# Patient Record
Sex: Female | Born: 1945
Health system: Southern US, Community
[De-identification: ages and names within clinical notes are randomized; demographics above are authoritative.]

## PROBLEM LIST (undated history)

## (undated) DIAGNOSIS — D509 Iron deficiency anemia, unspecified: Secondary | ICD-10-CM

## (undated) DIAGNOSIS — R0902 Hypoxemia: Secondary | ICD-10-CM

## (undated) DIAGNOSIS — K909 Intestinal malabsorption, unspecified: Secondary | ICD-10-CM

## (undated) HISTORY — DX: Iron deficiency anemia, unspecified: D50.9

## (undated) HISTORY — PX: PARTIAL HYSTERECTOMY: SHX80

## (undated) HISTORY — PX: HIATAL HERNIA REPAIR: SHX195

## (undated) HISTORY — DX: Intestinal malabsorption, unspecified: K90.9

## (undated) HISTORY — DX: Hypoxemia: R09.02

---

## 2012-05-04 DIAGNOSIS — K449 Diaphragmatic hernia without obstruction or gangrene: Secondary | ICD-10-CM

## 2012-05-04 HISTORY — DX: Diaphragmatic hernia without obstruction or gangrene: K44.9

## 2013-07-30 DIAGNOSIS — R109 Unspecified abdominal pain: Secondary | ICD-10-CM

## 2013-07-30 DIAGNOSIS — R519 Headache, unspecified: Secondary | ICD-10-CM | POA: Insufficient documentation

## 2013-07-30 HISTORY — DX: Unspecified abdominal pain: R10.9

## 2013-09-03 DIAGNOSIS — M545 Low back pain, unspecified: Secondary | ICD-10-CM | POA: Insufficient documentation

## 2013-09-03 DIAGNOSIS — K219 Gastro-esophageal reflux disease without esophagitis: Secondary | ICD-10-CM | POA: Insufficient documentation

## 2013-09-03 DIAGNOSIS — M940 Chondrocostal junction syndrome [Tietze]: Secondary | ICD-10-CM

## 2013-09-03 HISTORY — DX: Chondrocostal junction syndrome (tietze): M94.0

## 2013-09-03 HISTORY — DX: Low back pain, unspecified: M54.50

## 2013-09-03 HISTORY — DX: Gastro-esophageal reflux disease without esophagitis: K21.9

## 2013-12-12 DIAGNOSIS — F319 Bipolar disorder, unspecified: Secondary | ICD-10-CM

## 2013-12-12 DIAGNOSIS — M810 Age-related osteoporosis without current pathological fracture: Secondary | ICD-10-CM | POA: Insufficient documentation

## 2013-12-12 DIAGNOSIS — H269 Unspecified cataract: Secondary | ICD-10-CM | POA: Insufficient documentation

## 2013-12-12 DIAGNOSIS — K5732 Diverticulitis of large intestine without perforation or abscess without bleeding: Secondary | ICD-10-CM

## 2013-12-12 DIAGNOSIS — F419 Anxiety disorder, unspecified: Secondary | ICD-10-CM | POA: Insufficient documentation

## 2013-12-12 DIAGNOSIS — E785 Hyperlipidemia, unspecified: Secondary | ICD-10-CM

## 2013-12-12 HISTORY — DX: Bipolar disorder, unspecified: F31.9

## 2013-12-12 HISTORY — DX: Hyperlipidemia, unspecified: E78.5

## 2013-12-12 HISTORY — DX: Diverticulitis of large intestine without perforation or abscess without bleeding: K57.32

## 2013-12-12 HISTORY — DX: Age-related osteoporosis without current pathological fracture: M81.0

## 2013-12-12 HISTORY — DX: Anxiety disorder, unspecified: F41.9

## 2013-12-31 DIAGNOSIS — W108XXA Fall (on) (from) other stairs and steps, initial encounter: Secondary | ICD-10-CM

## 2013-12-31 HISTORY — DX: Fall (on) (from) other stairs and steps, initial encounter: W10.8XXA

## 2014-11-19 DIAGNOSIS — R748 Abnormal levels of other serum enzymes: Secondary | ICD-10-CM

## 2014-11-19 HISTORY — DX: Abnormal levels of other serum enzymes: R74.8

## 2014-11-22 DIAGNOSIS — N289 Disorder of kidney and ureter, unspecified: Secondary | ICD-10-CM

## 2014-11-22 DIAGNOSIS — J029 Acute pharyngitis, unspecified: Secondary | ICD-10-CM

## 2014-11-22 HISTORY — DX: Disorder of kidney and ureter, unspecified: N28.9

## 2014-11-22 HISTORY — DX: Acute pharyngitis, unspecified: J02.9

## 2015-01-24 DIAGNOSIS — Z1239 Encounter for other screening for malignant neoplasm of breast: Secondary | ICD-10-CM | POA: Diagnosis not present

## 2015-01-24 DIAGNOSIS — Z1231 Encounter for screening mammogram for malignant neoplasm of breast: Secondary | ICD-10-CM | POA: Diagnosis not present

## 2015-02-11 DIAGNOSIS — N289 Disorder of kidney and ureter, unspecified: Secondary | ICD-10-CM | POA: Diagnosis not present

## 2015-02-21 DIAGNOSIS — R7989 Other specified abnormal findings of blood chemistry: Secondary | ICD-10-CM | POA: Diagnosis not present

## 2015-02-21 DIAGNOSIS — R748 Abnormal levels of other serum enzymes: Secondary | ICD-10-CM | POA: Diagnosis not present

## 2015-06-16 DIAGNOSIS — M62838 Other muscle spasm: Secondary | ICD-10-CM | POA: Insufficient documentation

## 2015-06-16 HISTORY — DX: Other muscle spasm: M62.838

## 2015-09-19 DIAGNOSIS — H9203 Otalgia, bilateral: Secondary | ICD-10-CM | POA: Diagnosis not present

## 2015-09-19 DIAGNOSIS — R42 Dizziness and giddiness: Secondary | ICD-10-CM | POA: Diagnosis not present

## 2015-09-19 DIAGNOSIS — Z90710 Acquired absence of both cervix and uterus: Secondary | ICD-10-CM | POA: Diagnosis not present

## 2015-09-19 DIAGNOSIS — M810 Age-related osteoporosis without current pathological fracture: Secondary | ICD-10-CM | POA: Diagnosis not present

## 2015-09-19 DIAGNOSIS — R899 Unspecified abnormal finding in specimens from other organs, systems and tissues: Secondary | ICD-10-CM | POA: Diagnosis not present

## 2015-09-19 DIAGNOSIS — M542 Cervicalgia: Secondary | ICD-10-CM | POA: Diagnosis not present

## 2015-09-19 DIAGNOSIS — E785 Hyperlipidemia, unspecified: Secondary | ICD-10-CM | POA: Diagnosis not present

## 2015-09-19 DIAGNOSIS — R69 Illness, unspecified: Secondary | ICD-10-CM | POA: Diagnosis not present

## 2015-09-19 DIAGNOSIS — Z9851 Tubal ligation status: Secondary | ICD-10-CM | POA: Diagnosis not present

## 2015-09-19 DIAGNOSIS — R930 Abnormal findings on diagnostic imaging of skull and head, not elsewhere classified: Secondary | ICD-10-CM | POA: Diagnosis not present

## 2015-09-19 DIAGNOSIS — R011 Cardiac murmur, unspecified: Secondary | ICD-10-CM | POA: Diagnosis not present

## 2015-09-19 DIAGNOSIS — I6349 Cerebral infarction due to embolism of other cerebral artery: Secondary | ICD-10-CM | POA: Diagnosis not present

## 2015-09-19 DIAGNOSIS — Q670 Congenital facial asymmetry: Secondary | ICD-10-CM | POA: Diagnosis not present

## 2015-09-19 DIAGNOSIS — I6930 Unspecified sequelae of cerebral infarction: Secondary | ICD-10-CM | POA: Diagnosis not present

## 2015-09-19 DIAGNOSIS — K219 Gastro-esophageal reflux disease without esophagitis: Secondary | ICD-10-CM | POA: Diagnosis not present

## 2015-09-23 DIAGNOSIS — M542 Cervicalgia: Secondary | ICD-10-CM | POA: Diagnosis not present

## 2015-09-23 DIAGNOSIS — I679 Cerebrovascular disease, unspecified: Secondary | ICD-10-CM

## 2015-09-23 DIAGNOSIS — R42 Dizziness and giddiness: Secondary | ICD-10-CM | POA: Diagnosis not present

## 2015-09-23 HISTORY — DX: Cerebrovascular disease, unspecified: I67.9

## 2015-09-29 DIAGNOSIS — M6281 Muscle weakness (generalized): Secondary | ICD-10-CM | POA: Diagnosis not present

## 2015-09-29 DIAGNOSIS — R293 Abnormal posture: Secondary | ICD-10-CM | POA: Diagnosis not present

## 2015-09-29 DIAGNOSIS — M2560 Stiffness of unspecified joint, not elsewhere classified: Secondary | ICD-10-CM | POA: Diagnosis not present

## 2015-09-29 DIAGNOSIS — M542 Cervicalgia: Secondary | ICD-10-CM | POA: Diagnosis not present

## 2015-10-14 DIAGNOSIS — L821 Other seborrheic keratosis: Secondary | ICD-10-CM | POA: Diagnosis not present

## 2015-10-14 DIAGNOSIS — L720 Epidermal cyst: Secondary | ICD-10-CM | POA: Diagnosis not present

## 2015-10-14 DIAGNOSIS — D225 Melanocytic nevi of trunk: Secondary | ICD-10-CM | POA: Diagnosis not present

## 2015-10-15 DIAGNOSIS — R42 Dizziness and giddiness: Secondary | ICD-10-CM | POA: Diagnosis not present

## 2015-10-28 DIAGNOSIS — M62838 Other muscle spasm: Secondary | ICD-10-CM | POA: Diagnosis not present

## 2015-10-29 DIAGNOSIS — M6281 Muscle weakness (generalized): Secondary | ICD-10-CM | POA: Diagnosis not present

## 2015-10-29 DIAGNOSIS — M2560 Stiffness of unspecified joint, not elsewhere classified: Secondary | ICD-10-CM | POA: Diagnosis not present

## 2015-10-29 DIAGNOSIS — M542 Cervicalgia: Secondary | ICD-10-CM | POA: Diagnosis not present

## 2015-10-29 DIAGNOSIS — R293 Abnormal posture: Secondary | ICD-10-CM | POA: Diagnosis not present

## 2015-11-05 DIAGNOSIS — Z961 Presence of intraocular lens: Secondary | ICD-10-CM | POA: Diagnosis not present

## 2015-11-05 DIAGNOSIS — H52203 Unspecified astigmatism, bilateral: Secondary | ICD-10-CM | POA: Diagnosis not present

## 2015-11-05 DIAGNOSIS — H524 Presbyopia: Secondary | ICD-10-CM | POA: Diagnosis not present

## 2015-12-22 DIAGNOSIS — M159 Polyosteoarthritis, unspecified: Secondary | ICD-10-CM

## 2015-12-22 DIAGNOSIS — R7989 Other specified abnormal findings of blood chemistry: Secondary | ICD-10-CM | POA: Diagnosis not present

## 2015-12-22 DIAGNOSIS — Z0000 Encounter for general adult medical examination without abnormal findings: Secondary | ICD-10-CM | POA: Diagnosis not present

## 2015-12-22 DIAGNOSIS — K760 Fatty (change of) liver, not elsewhere classified: Secondary | ICD-10-CM | POA: Insufficient documentation

## 2015-12-22 DIAGNOSIS — E611 Iron deficiency: Secondary | ICD-10-CM | POA: Diagnosis not present

## 2015-12-22 DIAGNOSIS — M8949 Other hypertrophic osteoarthropathy, multiple sites: Secondary | ICD-10-CM

## 2015-12-22 DIAGNOSIS — Z1159 Encounter for screening for other viral diseases: Secondary | ICD-10-CM | POA: Diagnosis not present

## 2015-12-22 DIAGNOSIS — M150 Primary generalized (osteo)arthritis: Secondary | ICD-10-CM

## 2015-12-22 HISTORY — DX: Primary generalized (osteo)arthritis: M15.0

## 2015-12-22 HISTORY — DX: Fatty (change of) liver, not elsewhere classified: K76.0

## 2015-12-22 HISTORY — DX: Polyosteoarthritis, unspecified: M15.9

## 2015-12-22 HISTORY — DX: Other hypertrophic osteoarthropathy, multiple sites: M89.49

## 2015-12-26 DIAGNOSIS — E611 Iron deficiency: Secondary | ICD-10-CM | POA: Diagnosis not present

## 2015-12-29 DIAGNOSIS — R7989 Other specified abnormal findings of blood chemistry: Secondary | ICD-10-CM | POA: Diagnosis not present

## 2015-12-30 DIAGNOSIS — D509 Iron deficiency anemia, unspecified: Secondary | ICD-10-CM | POA: Diagnosis not present

## 2015-12-30 DIAGNOSIS — R5383 Other fatigue: Secondary | ICD-10-CM | POA: Diagnosis not present

## 2016-01-13 DIAGNOSIS — D500 Iron deficiency anemia secondary to blood loss (chronic): Secondary | ICD-10-CM | POA: Diagnosis not present

## 2016-01-13 DIAGNOSIS — D508 Other iron deficiency anemias: Secondary | ICD-10-CM | POA: Insufficient documentation

## 2016-01-13 HISTORY — DX: Other iron deficiency anemias: D50.8

## 2016-01-22 DIAGNOSIS — K640 First degree hemorrhoids: Secondary | ICD-10-CM | POA: Diagnosis not present

## 2016-01-22 DIAGNOSIS — K649 Unspecified hemorrhoids: Secondary | ICD-10-CM | POA: Diagnosis not present

## 2016-01-22 DIAGNOSIS — K449 Diaphragmatic hernia without obstruction or gangrene: Secondary | ICD-10-CM | POA: Diagnosis not present

## 2016-01-22 DIAGNOSIS — D500 Iron deficiency anemia secondary to blood loss (chronic): Secondary | ICD-10-CM | POA: Diagnosis not present

## 2016-01-22 DIAGNOSIS — K648 Other hemorrhoids: Secondary | ICD-10-CM | POA: Diagnosis not present

## 2016-01-22 DIAGNOSIS — D122 Benign neoplasm of ascending colon: Secondary | ICD-10-CM | POA: Diagnosis not present

## 2016-01-22 DIAGNOSIS — K635 Polyp of colon: Secondary | ICD-10-CM | POA: Diagnosis not present

## 2016-01-22 DIAGNOSIS — K5730 Diverticulosis of large intestine without perforation or abscess without bleeding: Secondary | ICD-10-CM | POA: Diagnosis not present

## 2016-02-09 DIAGNOSIS — E611 Iron deficiency: Secondary | ICD-10-CM | POA: Diagnosis not present

## 2016-02-23 DIAGNOSIS — E611 Iron deficiency: Secondary | ICD-10-CM | POA: Diagnosis not present

## 2016-02-24 DIAGNOSIS — E611 Iron deficiency: Secondary | ICD-10-CM | POA: Diagnosis not present

## 2016-03-04 ENCOUNTER — Encounter: Payer: Self-pay | Admitting: Family

## 2016-03-04 ENCOUNTER — Other Ambulatory Visit (HOSPITAL_BASED_OUTPATIENT_CLINIC_OR_DEPARTMENT_OTHER): Payer: Medicare HMO

## 2016-03-04 ENCOUNTER — Ambulatory Visit (HOSPITAL_BASED_OUTPATIENT_CLINIC_OR_DEPARTMENT_OTHER): Payer: Medicare HMO | Admitting: Family

## 2016-03-04 ENCOUNTER — Ambulatory Visit: Payer: Medicare HMO

## 2016-03-04 ENCOUNTER — Other Ambulatory Visit: Payer: Self-pay | Admitting: Family

## 2016-03-04 DIAGNOSIS — D649 Anemia, unspecified: Secondary | ICD-10-CM

## 2016-03-04 DIAGNOSIS — D500 Iron deficiency anemia secondary to blood loss (chronic): Secondary | ICD-10-CM

## 2016-03-04 DIAGNOSIS — D509 Iron deficiency anemia, unspecified: Secondary | ICD-10-CM | POA: Diagnosis not present

## 2016-03-04 DIAGNOSIS — Z720 Tobacco use: Secondary | ICD-10-CM | POA: Diagnosis not present

## 2016-03-04 DIAGNOSIS — K909 Intestinal malabsorption, unspecified: Secondary | ICD-10-CM | POA: Insufficient documentation

## 2016-03-04 DIAGNOSIS — K219 Gastro-esophageal reflux disease without esophagitis: Secondary | ICD-10-CM

## 2016-03-04 DIAGNOSIS — Z8673 Personal history of transient ischemic attack (TIA), and cerebral infarction without residual deficits: Secondary | ICD-10-CM | POA: Diagnosis not present

## 2016-03-04 DIAGNOSIS — Z803 Family history of malignant neoplasm of breast: Secondary | ICD-10-CM

## 2016-03-04 DIAGNOSIS — Z800 Family history of malignant neoplasm of digestive organs: Secondary | ICD-10-CM | POA: Diagnosis not present

## 2016-03-04 DIAGNOSIS — Z808 Family history of malignant neoplasm of other organs or systems: Secondary | ICD-10-CM

## 2016-03-04 HISTORY — DX: Iron deficiency anemia, unspecified: D50.9

## 2016-03-04 HISTORY — DX: Intestinal malabsorption, unspecified: K90.9

## 2016-03-04 LAB — CBC WITH DIFFERENTIAL (CANCER CENTER ONLY)
BASO#: 0 10*3/uL (ref 0.0–0.2)
BASO%: 0.9 % (ref 0.0–2.0)
EOS ABS: 0.5 10*3/uL (ref 0.0–0.5)
EOS%: 10.8 % — ABNORMAL HIGH (ref 0.0–7.0)
HEMATOCRIT: 34.1 % — AB (ref 34.8–46.6)
HEMOGLOBIN: 10.5 g/dL — AB (ref 11.6–15.9)
LYMPH#: 1.2 10*3/uL (ref 0.9–3.3)
LYMPH%: 25.7 % (ref 14.0–48.0)
MCH: 24.6 pg — AB (ref 26.0–34.0)
MCHC: 30.8 g/dL — AB (ref 32.0–36.0)
MCV: 80 fL — ABNORMAL LOW (ref 81–101)
MONO#: 0.7 10*3/uL (ref 0.1–0.9)
MONO%: 14.6 % — ABNORMAL HIGH (ref 0.0–13.0)
NEUT#: 2.2 10*3/uL (ref 1.5–6.5)
NEUT%: 48 % (ref 39.6–80.0)
Platelets: 370 10*3/uL (ref 145–400)
RBC: 4.27 10*6/uL (ref 3.70–5.32)
RDW: 17.5 % — ABNORMAL HIGH (ref 11.1–15.7)
WBC: 4.5 10*3/uL (ref 3.9–10.0)

## 2016-03-04 LAB — LACTATE DEHYDROGENASE: LDH: 175 U/L (ref 125–245)

## 2016-03-04 LAB — CHCC SATELLITE - SMEAR

## 2016-03-04 NOTE — Progress Notes (Signed)
Hematology/Oncology Consultation   Name: Jasmin Hughes      MRN: [id number]    Location: Room/bed info not found  Date: 03/04/2016 Time:1:28 PM   REFERRING PHYSICIAN: Ardith Dark, PAC  REASON FOR CONSULT: Iron deficiency anemia    DIAGNOSIS: No diagnosis found.  HISTORY OF PRESENT ILLNESS: [name] is a very pleasant 71 yo white female with history of iron deficiency anemia. She states that when she was 71 years old she was hospitalized for this but can not remember if she was transfused or received IV iron.  She is symptomatic with dizziness (no falls or syncope), weakness, fatigue, fatigue, thirst and chewing lots of ice.  She has been taking a woman's one a day vitamin with iron in it off and on. She is on an antacid as well which is likely blocking absorption of iron. The iron supplement is causing her to have some constipation.  She recently had her colonoscopy with 1 polyp removed which was benign.  She is on zantac once a day for GERD and has history of hiatal hernia repair.  She has had no problems with bleeding during or after surgery. No excessive bruising or petechiae.  She denies any family history of anemia.  No personal cancer history. Familial cancer history includes: mother - stomach and esophageal (smoker), maternal aunt - metastatic colon cancer, maternal aunt - colon cancer, maternal aunt - hepatic, maternal aunt - breast and paternal aunt - esophageal (non-smoker).  She had a partial hysterectomy years ago.  She has history of bilateral mini strokes. She states that she has had two episodes where she became SOB without exertion with palpitations and pain in both jaws. She states that this passed without intervention. I advised her to follow-up with cardiology and let them know about this and to call EMS if this continues to happen.  No problem with infections. No fever, chills, n/v, cough, rash, SOB, chest pain, palpitations or changes in bowel or bladder habits.   She states that she has occasional right upper quadrant pain. I was unable to feel liver or spleen tip on exam.  No lymphadenopathy found on exam.  No swelling, tenderness, numbness or tingling in her extremities. She has history of osteoporosis but does not want treatment for this due to possible side effects.  She has maintained a good appetite and is staying well hydrated. Her weight is stable.  She is a smoker, smoking 1 pack every 2-3 days. She does not drink alcohol.   ROS: All other 10 point review of systems is negative.   PAST MEDICAL HISTORY:   No past medical history on file.  ALLERGIES: Allergies  Allergen Reactions  . Erythromycin Itching      MEDICATIONS:  No current outpatient prescriptions on file prior to visit.   No current facility-administered medications on file prior to visit.      PAST SURGICAL HISTORY No past surgical history on file.  FAMILY HISTORY: No family history on file.  SOCIAL HISTORY:  has no tobacco, alcohol, and drug history on file.  PERFORMANCE STATUS: The patient's performance status is 1 - Symptomatic but completely ambulatory  PHYSICAL EXAM: Most Recent Vital Signs: Blood pressure (!) 152/87, pulse 74, temperature 97.8 F (36.6 C), temperature source Oral, weight 142 lb 12.8 oz (64.8 kg). BP (!) 152/87 (BP Location: Right Arm, Patient Position: Sitting)   Pulse 74   Temp 97.8 F (36.6 C) (Oral)   Wt 142 lb 12.8 oz (64.8 kg)  General Appearance:    Alert, cooperative, no distress, appears stated age  Head:    Normocephalic, without obvious abnormality, atraumatic  Eyes:    PERRL, conjunctiva/corneas clear, EOM's intact, fundi    benign, both eyes        Throat:   Lips, mucosa, and tongue normal; teeth and gums normal  Neck:   Supple, symmetrical, trachea midline, no adenopathy;    thyroid:  no enlargement/tenderness/nodules; no carotid   bruit or JVD  Back:     Symmetric, no curvature, ROM normal, no CVA tenderness   Lungs:     Clear to auscultation bilaterally, respirations unlabored  Chest Wall:    No tenderness or deformity   Heart:    Regular rate and rhythm, S1 and S2 normal, no murmur, rub   or gallop     Abdomen:     Soft, non-tender, bowel sounds active all four quadrants,    no masses, no organomegaly        Extremities:   Extremities normal, atraumatic, no cyanosis or edema  Pulses:   2+ and symmetric all extremities  Skin:   Skin color, texture, turgor normal, no rashes or lesions  Lymph nodes:   Cervical, supraclavicular, and axillary nodes normal  Neurologic:   CNII-XII intact, normal strength, sensation and reflexes    throughout   LABORATORY DATA:  Results for orders placed or performed in visit on 03/04/16 (from the past 48 hour(s))  CBC w/Diff     Status: Abnormal   Collection Time: 03/04/16 12:47 PM  Result Value Ref Range   WBC 4.5 3.9 - 10.0 10e3/uL   RBC 4.27 3.70 - 5.32 10e6/uL   HGB 10.5 (L) 11.6 - 15.9 g/dL   HCT 34.1 (L) 34.8 - 46.6 %   MCV 80 (L) 81 - 101 fL   MCH 24.6 (L) 26.0 - 34.0 pg   MCHC 30.8 (L) 32.0 - 36.0 g/dL   RDW 17.5 (H) 11.1 - 15.7 %   Platelets 370 145 - 400 10e3/uL   NEUT# 2.2 1.5 - 6.5 10e3/uL   LYMPH# 1.2 0.9 - 3.3 10e3/uL   MONO# 0.7 0.1 - 0.9 10e3/uL   Eosinophils Absolute 0.5 0.0 - 0.5 10e3/uL   BASO# 0.0 0.0 - 0.2 10e3/uL   NEUT% 48.0 39.6 - 80.0 %   LYMPH% 25.7 14.0 - 48.0 %   MONO% 14.6 (H) 0.0 - 13.0 %   EOS% 10.8 (H) 0.0 - 7.0 %   BASO% 0.9 0.0 - 2.0 %  Smear     Status: None   Collection Time: 03/04/16 12:47 PM  Result Value Ref Range   Smear Result Smear Available       RADIOGRAPHY: No results found.     PATHOLOGY: None   ASSESSMENT/PLAN: [name] is a very pleasant 71 yo white female with history of iron deficiency anemia despite being an oral iron supplement. Hgb is stable at 10.5 with an MCV of 80. She is symptomatic with fatigue, weakness, dizziness and chewing ice.  Her colonoscopy was negative for bleeding. She  had one benign polyp removed.  We will see what her iron studies show and bring her in next week for an infusion if needed.   All questions were answered. Both she and her husband know to contact our office with any questions or concerns. We can certainly see her much sooner if necessary.  She was discussed with and also seen by Dr. Marin Olp and he is in agreement with the  aforementioned.   [facility]     Addendum:  I saw and examined the patient with Emmily Pellegrin. I did look at her blood on her the microscope. I did not see anything that looked suspicious for any hematologic malignancy. Her blood smear looked suspicious for iron deficiency.  Her exam is pretty much unremarkable.  She does not appear to be all that symptomatic although she does feel tired.  We will see what her iron studies show. We will get IV iron and her. I know that this will work if she has low iron studies.  We probably will get her back to see [place] in another 6 weeks. I would think that she should have a nice improvement in her labs and also in her symptoms with IV iron.  We answered all of her questions. I thanked her husband who served in the [place] [employer].   Lattie Haw, MD

## 2016-03-05 ENCOUNTER — Other Ambulatory Visit: Payer: Self-pay | Admitting: Family

## 2016-03-05 LAB — HEMOGLOBINOPATHY EVALUATION
HEMOGLOBIN F QUANTITATION: 0 % (ref 0.0–2.0)
HGB A: 98 % (ref 96.4–98.8)
HGB C: 0 %
HGB S: 0 %
HGB VARIANT: 0 %
Hemoglobin A2 Quantitation: 2 % (ref 1.8–3.2)

## 2016-03-05 LAB — FERRITIN: FERRITIN: 16 ng/mL (ref 9–269)

## 2016-03-05 LAB — IRON AND TIBC
%SAT: 15 % — AB (ref 21–57)
IRON: 60 ug/dL (ref 41–142)
TIBC: 409 ug/dL (ref 236–444)
UIBC: 349 ug/dL (ref 120–384)

## 2016-03-05 LAB — ERYTHROPOIETIN: Erythropoietin: 17.7 m[IU]/mL (ref 2.6–18.5)

## 2016-03-05 LAB — RETICULOCYTES: RETICULOCYTE COUNT: 0.9 % (ref 0.6–2.6)

## 2016-03-08 ENCOUNTER — Ambulatory Visit (HOSPITAL_BASED_OUTPATIENT_CLINIC_OR_DEPARTMENT_OTHER): Payer: Medicare HMO

## 2016-03-08 ENCOUNTER — Other Ambulatory Visit: Payer: Self-pay | Admitting: Family

## 2016-03-08 VITALS — BP 154/84 | HR 84 | Temp 98.2°F | Resp 20

## 2016-03-08 DIAGNOSIS — K909 Intestinal malabsorption, unspecified: Secondary | ICD-10-CM | POA: Diagnosis not present

## 2016-03-08 DIAGNOSIS — D500 Iron deficiency anemia secondary to blood loss (chronic): Secondary | ICD-10-CM | POA: Diagnosis not present

## 2016-03-08 MED ORDER — SODIUM CHLORIDE 0.9 % IV SOLN
Freq: Once | INTRAVENOUS | Status: AC
  Administered 2016-03-08: 10:00:00 via INTRAVENOUS

## 2016-03-08 MED ORDER — SODIUM CHLORIDE 0.9 % IV SOLN
510.0000 mg | Freq: Once | INTRAVENOUS | Status: AC
  Administered 2016-03-08: 510 mg via INTRAVENOUS
  Filled 2016-03-08: qty 17

## 2016-03-08 NOTE — Patient Instructions (Signed)
Ferumoxytol injection What is this medicine? FERUMOXYTOL is an iron complex. Iron is used to make healthy red blood cells, which carry oxygen and nutrients throughout the body. This medicine is used to treat iron deficiency anemia in people with chronic kidney disease. COMMON BRAND NAME(S): Feraheme What should I tell my health care provider before I take this medicine? They need to know if you have any of these conditions: -anemia not caused by low iron levels -high levels of iron in the blood -magnetic resonance imaging (MRI) test scheduled -an unusual or allergic reaction to iron, other medicines, foods, dyes, or preservatives -pregnant or trying to get pregnant -breast-feeding How should I use this medicine? This medicine is for injection into a vein. It is given by a health care professional in a hospital or clinic setting. Talk to your pediatrician regarding the use of this medicine in children. Special care may be needed. What if I miss a dose? It is important not to miss your dose. Call your doctor or health care professional if you are unable to keep an appointment. What may interact with this medicine? This medicine may interact with the following medications: -other iron products What should I watch for while using this medicine? Visit your doctor or healthcare professional regularly. Tell your doctor or healthcare professional if your symptoms do not start to get better or if they get worse. You may need blood work done while you are taking this medicine. You may need to follow a special diet. Talk to your doctor. Foods that contain iron include: whole grains/cereals, dried fruits, beans, or peas, leafy green vegetables, and organ meats (liver, kidney). What side effects may I notice from receiving this medicine? Side effects that you should report to your doctor or health care professional as soon as possible: -allergic reactions like skin rash, itching or hives, swelling of the  face, lips, or tongue -breathing problems -changes in blood pressure -feeling faint or lightheaded, falls -fever or chills -flushing, sweating, or hot feelings -swelling of the ankles or feet Side effects that usually do not require medical attention (report to your doctor or health care professional if they continue or are bothersome): -diarrhea -headache -nausea, vomiting -stomach pain Where should I keep my medicine? This drug is given in a hospital or clinic and will not be stored at home.  2017 Elsevier/Gold Standard (2015-01-30 12:41:49)  

## 2016-03-16 ENCOUNTER — Ambulatory Visit (HOSPITAL_BASED_OUTPATIENT_CLINIC_OR_DEPARTMENT_OTHER): Payer: Medicare HMO

## 2016-03-16 VITALS — BP 125/72 | HR 85 | Temp 98.3°F | Resp 16

## 2016-03-16 DIAGNOSIS — K909 Intestinal malabsorption, unspecified: Secondary | ICD-10-CM

## 2016-03-16 DIAGNOSIS — D500 Iron deficiency anemia secondary to blood loss (chronic): Secondary | ICD-10-CM

## 2016-03-16 MED ORDER — SODIUM CHLORIDE 0.9 % IV SOLN
510.0000 mg | Freq: Once | INTRAVENOUS | Status: AC
  Administered 2016-03-16: 510 mg via INTRAVENOUS
  Filled 2016-03-16: qty 17

## 2016-03-16 MED ORDER — SODIUM CHLORIDE 0.9 % IV SOLN
Freq: Once | INTRAVENOUS | Status: AC
  Administered 2016-03-16: 10:00:00 via INTRAVENOUS

## 2016-03-16 NOTE — Patient Instructions (Signed)
Anemia, Nonspecific Anemia is a condition in which the concentration of red blood cells or hemoglobin in the blood is below normal. Hemoglobin is a substance in red blood cells that carries oxygen to the tissues of the body. Anemia results in not enough oxygen reaching these tissues. What are the causes? Common causes of anemia include:  Excessive bleeding. Bleeding may be internal or external. This includes excessive bleeding from periods (in women) or from the intestine.  Poor nutrition.  Chronic kidney, thyroid, and liver disease.  Bone marrow disorders that decrease red blood cell production.  Cancer and treatments for cancer.  HIV, AIDS, and their treatments.  Spleen problems that increase red blood cell destruction.  Blood disorders.  Excess destruction of red blood cells due to infection, medicines, and autoimmune disorders. What are the signs or symptoms?  Minor weakness.  Dizziness.  Headache.  Palpitations.  Shortness of breath, especially with exercise.  Paleness.  Cold sensitivity.  Indigestion.  Nausea.  Difficulty sleeping.  Difficulty concentrating. Symptoms may occur suddenly or they may develop slowly. How is this diagnosed? Additional blood tests are often needed. These help your health care provider determine the best treatment. Your health care provider will check your stool for blood and look for other causes of blood loss. How is this treated? Treatment varies depending on the cause of the anemia. Treatment can include:  Supplements of iron, vitamin B12, or folic acid.  Hormone medicines.  A blood transfusion. This may be needed if blood loss is severe.  Hospitalization. This may be needed if there is significant continual blood loss.  Dietary changes.  Spleen removal. Follow these instructions at home: Keep all follow-up appointments. It often takes many weeks to correct anemia, and having your health care provider check on your  condition and your response to treatment is very important. Get help right away if:  You develop extreme weakness, shortness of breath, or chest pain.  You become dizzy or have trouble concentrating.  You develop heavy vaginal bleeding.  You develop a rash.  You have bloody or black, tarry stools.  You faint.  You vomit up blood.  You vomit repeatedly.  You have abdominal pain.  You have a fever or persistent symptoms for more than 2-3 days.  You have a fever and your symptoms suddenly get worse.  You are dehydrated. This information is not intended to replace advice given to you by your health care provider. Make sure you discuss any questions you have with your health care provider. Document Released: 02/05/2004 Document Revised: 06/11/2015 Document Reviewed: 06/23/2012 Elsevier Interactive Patient Education  2017 Elsevier Inc.  

## 2016-04-16 ENCOUNTER — Other Ambulatory Visit (HOSPITAL_BASED_OUTPATIENT_CLINIC_OR_DEPARTMENT_OTHER): Payer: Medicare HMO

## 2016-04-16 ENCOUNTER — Ambulatory Visit (HOSPITAL_BASED_OUTPATIENT_CLINIC_OR_DEPARTMENT_OTHER): Payer: Medicare HMO | Admitting: Family

## 2016-04-16 ENCOUNTER — Other Ambulatory Visit: Payer: Self-pay | Admitting: Family

## 2016-04-16 ENCOUNTER — Telehealth: Payer: Self-pay | Admitting: *Deleted

## 2016-04-16 VITALS — BP 145/85 | HR 87 | Temp 98.7°F | Resp 16 | Wt 145.0 lb

## 2016-04-16 DIAGNOSIS — D508 Other iron deficiency anemias: Secondary | ICD-10-CM

## 2016-04-16 DIAGNOSIS — K909 Intestinal malabsorption, unspecified: Secondary | ICD-10-CM

## 2016-04-16 DIAGNOSIS — D509 Iron deficiency anemia, unspecified: Secondary | ICD-10-CM

## 2016-04-16 DIAGNOSIS — R5383 Other fatigue: Secondary | ICD-10-CM

## 2016-04-16 LAB — IRON AND TIBC
%SAT: 38 % (ref 21–57)
IRON: 92 ug/dL (ref 41–142)
TIBC: 244 ug/dL (ref 236–444)
UIBC: 152 ug/dL (ref 120–384)

## 2016-04-16 LAB — CBC WITH DIFFERENTIAL (CANCER CENTER ONLY)
BASO#: 0.1 10*3/uL (ref 0.0–0.2)
BASO%: 1.4 % (ref 0.0–2.0)
EOS%: 10.1 % — AB (ref 0.0–7.0)
Eosinophils Absolute: 0.5 10*3/uL (ref 0.0–0.5)
HEMATOCRIT: 38.7 % (ref 34.8–46.6)
HEMOGLOBIN: 12.3 g/dL (ref 11.6–15.9)
LYMPH#: 1.3 10*3/uL (ref 0.9–3.3)
LYMPH%: 25 % (ref 14.0–48.0)
MCH: 27.4 pg (ref 26.0–34.0)
MCHC: 31.8 g/dL — AB (ref 32.0–36.0)
MCV: 86 fL (ref 81–101)
MONO#: 0.7 10*3/uL (ref 0.1–0.9)
MONO%: 13.9 % — AB (ref 0.0–13.0)
NEUT%: 49.6 % (ref 39.6–80.0)
NEUTROS ABS: 2.5 10*3/uL (ref 1.5–6.5)
Platelets: 311 10*3/uL (ref 145–400)
RBC: 4.49 10*6/uL (ref 3.70–5.32)
RDW: 19 % — ABNORMAL HIGH (ref 11.1–15.7)
WBC: 5.1 10*3/uL (ref 3.9–10.0)

## 2016-04-16 LAB — FERRITIN: FERRITIN: 178 ng/mL (ref 9–269)

## 2016-04-16 LAB — RETICULOCYTES: RETICULOCYTE COUNT: 0.7 % (ref 0.6–2.6)

## 2016-04-16 NOTE — Progress Notes (Signed)
Hematology and Oncology Follow Up Visit  Jasmin Hughes [id number] 24-Mar-1945 71 y.o. 04/16/2016   Principle Diagnosis:  Iron deficiency anemia secondary to malabsorption  Current Therapy:   IV iron as indicated - last received in Feb/March x 2 doses  Interim History:  Jasmin Hughes is here today with her husband for follow-up. She is feeling better since receiving 2 doses of IV iron 6 weeks ago. Her iron saturation at that time was 15% and ferritin 16. Hgb is improved at 12.3 with an MCV of 86. Today's iron studies are pending.  She is still having some occasional fatigue and has noticed that she is chewing less ice now.  No fever, chills, n/v, cough, rash, dizziness, SOB, chest pain, palpitations, abdominal pain or changes in bowel or bladder habits.  No episodes of bleeding, bruising or petechiae. No lymphadenopathy.  No swelling, tenderness, numbness or tingling in her extremities. No new aches or pains.  She has maintained a good appetite and is staying well hydrated. Her weight is stable.   ECOG Performance Status: 1 - Symptomatic but completely ambulatory  Medications:  Allergies as of 04/16/2016      Reactions   Erythromycin Itching      Medication List       Accurate as of 04/16/16  9:50 AM. Always use your most recent med list.          doxepin 25 MG capsule Commonly known as:  SINEQUAN Take 25 mg by mouth every morning.   ranitidine 300 MG tablet Commonly known as:  ZANTAC Take 300 mg by mouth every morning.   venlafaxine 37.5 MG tablet Commonly known as:  EFFEXOR Take 37.5 mg by mouth every morning.       Allergies:  Allergies  Allergen Reactions  . Erythromycin Itching    Past Medical History, Surgical history, Social history, and Family History were reviewed and updated.  Review of Systems: All other 10 point review of systems is negative.   Physical Exam:  vitals were not taken for this visit.  Wt Readings from Last 3 Encounters:    03/04/16 142 lb 12.8 oz (64.8 kg)    Ocular: Sclerae unicteric, pupils equal, round and reactive to light Ear-nose-throat: Oropharynx clear, dentition fair Lymphatic: No cervical, supraclavicular or axillary adenopathy Lungs no rales or rhonchi, good excursion bilaterally Heart regular rate and rhythm, no murmur appreciated Abd soft, nontender, positive bowel sounds, no liver or spleen tip palpated on exam, no fluid wave MSK no focal spinal tenderness, no joint edema Neuro: non-focal, well-oriented, appropriate affect Breasts: Deferred  Lab Results  Component Value Date   WBC 5.1 04/16/2016   HGB 12.3 04/16/2016   HCT 38.7 04/16/2016   MCV 86 04/16/2016   PLT 311 04/16/2016   Lab Results  Component Value Date   FERRITIN 16 03/04/2016   IRON 60 03/04/2016   TIBC 409 03/04/2016   UIBC 349 03/04/2016   IRONPCTSAT 15 (L) 03/04/2016   Lab Results  Component Value Date   RBC 4.49 04/16/2016   No results found for: KPAFRELGTCHN, LAMBDASER, KAPLAMBRATIO No results found for: IGGSERUM, IGA, IGMSERUM No results found for: TOTALPROTELP, ALBUMINELP, A1GS, A2GS, BETS, BETA2SER, GAMS, MSPIKE, SPEI   Chemistry   No results found for: NA, K, CL, CO2, BUN, CREATININE, GLU No results found for: CALCIUM, ALKPHOS, AST, ALT, BILITOT   Impression and Plan: Jasmin Hughes is a very pleasant 71 yo white female with history of iron deficiency anemia secondary to malabsorption. She appears  to have responded nicely to the 2 doses of IV iron she received 6 weeks ago. She is feeling better and her symptoms continue to improve. She has still had some mild fatigue but chewing less ice.  We will see what her iron studies show and bring her back in next week for an infusion if needed.  We will plan to see her back in 2 months for repeat lab work and follow-up.  I spent at least 25 minute with she and her husband face to face counseling.  They know to contact our office with any questions or concerns. We  can certainly see her sooner if need be.   Eliezer Bottom, NP 4/6/20189:50 AM

## 2016-04-16 NOTE — Telephone Encounter (Signed)
-----   Message from Eliezer Bottom, NP sent at 04/16/2016  2:11 PM EDT ----- Regarding: Iron  Iron studies look good. No infusion needed. Thank you!  Sarah  ----- Message ----- From: Interface, Lab In Three Zero One Sent: 04/16/2016   9:39 AM To: Eliezer Bottom, NP

## 2016-04-16 NOTE — Telephone Encounter (Signed)
Spoke with patient letting her know no iron infusion needed per Clarise Cruz, NP.

## 2016-04-19 ENCOUNTER — Telehealth: Payer: Self-pay | Admitting: *Deleted

## 2016-04-19 NOTE — Telephone Encounter (Addendum)
Patient is aware of results  ----- Message from Eliezer Bottom, NP sent at 04/19/2016  8:55 AM EDT ----- Regarding: Iron  Iron studies look great. No infusion needed at this time. Thank you!  Jasmin Hughes  ----- Message ----- From: Interface, Lab In Three Zero One Sent: 04/16/2016   9:39 AM To: Eliezer Bottom, NP

## 2016-06-16 ENCOUNTER — Other Ambulatory Visit (HOSPITAL_BASED_OUTPATIENT_CLINIC_OR_DEPARTMENT_OTHER): Payer: Medicare HMO

## 2016-06-16 ENCOUNTER — Ambulatory Visit (HOSPITAL_BASED_OUTPATIENT_CLINIC_OR_DEPARTMENT_OTHER): Payer: Medicare HMO | Admitting: Family

## 2016-06-16 VITALS — BP 153/73 | HR 69 | Temp 98.1°F | Resp 16 | Wt 138.0 lb

## 2016-06-16 DIAGNOSIS — D509 Iron deficiency anemia, unspecified: Secondary | ICD-10-CM

## 2016-06-16 DIAGNOSIS — D508 Other iron deficiency anemias: Secondary | ICD-10-CM | POA: Diagnosis not present

## 2016-06-16 DIAGNOSIS — K909 Intestinal malabsorption, unspecified: Secondary | ICD-10-CM

## 2016-06-16 LAB — CBC WITH DIFFERENTIAL (CANCER CENTER ONLY)
BASO#: 0 10*3/uL (ref 0.0–0.2)
BASO%: 0.7 % (ref 0.0–2.0)
EOS%: 6.4 % (ref 0.0–7.0)
Eosinophils Absolute: 0.4 10*3/uL (ref 0.0–0.5)
HCT: 40.8 % (ref 34.8–46.6)
HGB: 13.8 g/dL (ref 11.6–15.9)
LYMPH#: 1.2 10*3/uL (ref 0.9–3.3)
LYMPH%: 21.1 % (ref 14.0–48.0)
MCH: 30.7 pg (ref 26.0–34.0)
MCHC: 33.8 g/dL (ref 32.0–36.0)
MCV: 91 fL (ref 81–101)
MONO#: 0.8 10*3/uL (ref 0.1–0.9)
MONO%: 14 % — ABNORMAL HIGH (ref 0.0–13.0)
NEUT#: 3.1 10*3/uL (ref 1.5–6.5)
NEUT%: 57.8 % (ref 39.6–80.0)
Platelets: 311 10*3/uL (ref 145–400)
RBC: 4.5 10*6/uL (ref 3.70–5.32)
RDW: 13.5 % (ref 11.1–15.7)
WBC: 5.4 10*3/uL (ref 3.9–10.0)

## 2016-06-16 LAB — IRON AND TIBC
%SAT: 35 % (ref 21–57)
Iron: 100 ug/dL (ref 41–142)
TIBC: 288 ug/dL (ref 236–444)
UIBC: 187 ug/dL (ref 120–384)

## 2016-06-16 LAB — FERRITIN: FERRITIN: 97 ng/mL (ref 9–269)

## 2016-06-16 NOTE — Progress Notes (Signed)
Hematology and Oncology Follow Up Visit  Jasmin Hughes [id number] 1945/01/24 71 y.o. 06/16/2016   Principle Diagnosis:  Iron deficiency anemia secondary to malabsorption  Current Therapy:   IV iron as indicated - last received in Feb/March x 2 doses    Interim History:  Jasmin Hughes is here today with her husband for follow-up. She is doing well and has no complaints at this time. Her energy has improved. Hgb is now up to 13.8. Iron studies are pending.  No fever, chills, n/v, cough, rash, dizziness, SOB, chest pain, palpitations, abdominal pain or changes in bowel or bladder habits.  No swelling, tenderness, numbness or tingling in her extremities. No c/o pain.  She has maintained a good appetite and is staying well hydrated. Her weight is down 7 lbs since her last visit.   ECOG Performance Status: 0 - Asymptomatic  Medications:  Allergies as of 06/16/2016      Reactions   Erythromycin Itching      Medication List       Accurate as of 06/16/16 11:15 AM. Always use your most recent med list.          doxepin 25 MG capsule Commonly known as:  SINEQUAN Take 25 mg by mouth every evening. As needed.   ranitidine 300 MG tablet Commonly known as:  ZANTAC Take 300 mg by mouth every evening.   venlafaxine 37.5 MG tablet Commonly known as:  EFFEXOR Take 37.5 mg by mouth every evening.       Allergies:  Allergies  Allergen Reactions  . Erythromycin Itching    Past Medical History, Surgical history, Social history, and Family History were reviewed and updated.  Review of Systems: All other 10 point review of systems is negative.   Physical Exam:  weight is 138 lb (62.6 kg). Her oral temperature is 98.1 F (36.7 C). Her blood pressure is 153/73 (abnormal) and her pulse is 69. Her respiration is 16 and oxygen saturation is 95%.   Wt Readings from Last 3 Encounters:  06/16/16 138 lb (62.6 kg)  04/16/16 145 lb (65.8 kg)  03/04/16 142 lb 12.8 oz (64.8 kg)     Ocular: Sclerae unicteric, pupils equal, round and reactive to light Ear-nose-throat: Oropharynx clear, dentition fair Lymphatic: No cervical, supraclavicular or axillary adenopathy Lungs no rales or rhonchi, good excursion bilaterally Heart regular rate and rhythm, no murmur appreciated Abd soft, nontender, positive bowel sounds, no liver or spleen tip palpated on exam, no fluid wave  MSK no focal spinal tenderness, no joint edema Neuro: non-focal, well-oriented, appropriate affect Breasts: Deferred   Lab Results  Component Value Date   WBC 5.4 06/16/2016   HGB 13.8 06/16/2016   HCT 40.8 06/16/2016   MCV 91 06/16/2016   PLT 311 06/16/2016   Lab Results  Component Value Date   FERRITIN 178 04/16/2016   IRON 92 04/16/2016   TIBC 244 04/16/2016   UIBC 152 04/16/2016   IRONPCTSAT 38 04/16/2016   Lab Results  Component Value Date   RBC 4.50 06/16/2016   No results found for: KPAFRELGTCHN, LAMBDASER, KAPLAMBRATIO No results found for: IGGSERUM, IGA, IGMSERUM No results found for: TOTALPROTELP, ALBUMINELP, A1GS, A2GS, BETS, BETA2SER, GAMS, MSPIKE, SPEI   Chemistry   No results found for: NA, K, CL, CO2, BUN, CREATININE, GLU No results found for: CALCIUM, ALKPHOS, AST, ALT, BILITOT    Impression and Plan: Jasmin Hughes is a very pleasant 71 yo caucasian female with iron deficiency anemia secondary to malabsorption. She has  improved since having IV iron earlier this year. Hgb is 13.8 with an MCV of 91. Iron studies are pending.  We will see what her counts show and bring her back in later this week for infusion If needed.  We will go ahead and plan to see her back in 4 months for repeat lab work and follow-up.  She will contact our office with any questions or concerns. We can certainly see her sooner if need be.   Eliezer Bottom, NP 6/6/201811:15 AM

## 2016-06-17 ENCOUNTER — Telehealth: Payer: Self-pay | Admitting: *Deleted

## 2016-06-17 LAB — RETICULOCYTES: Reticulocyte Count: 0.8 % (ref 0.6–2.6)

## 2016-06-17 NOTE — Telephone Encounter (Addendum)
Patient is aware of results  ----- Message from Eliezer Bottom, NP sent at 06/16/2016  5:19 PM EDT ----- Regarding: Iron  Iron studies look good. No infusion needed! Thank you!  Sarah  ----- Message ----- From: Interface, Lab In Three Zero One Sent: 06/16/2016  10:27 AM To: Eliezer Bottom, NP

## 2016-10-14 ENCOUNTER — Other Ambulatory Visit (HOSPITAL_BASED_OUTPATIENT_CLINIC_OR_DEPARTMENT_OTHER): Payer: Medicare HMO

## 2016-10-14 ENCOUNTER — Ambulatory Visit (HOSPITAL_BASED_OUTPATIENT_CLINIC_OR_DEPARTMENT_OTHER): Payer: Medicare HMO | Admitting: Hematology & Oncology

## 2016-10-14 VITALS — BP 151/83 | HR 79 | Temp 98.5°F | Resp 16 | Wt 137.0 lb

## 2016-10-14 DIAGNOSIS — K909 Intestinal malabsorption, unspecified: Secondary | ICD-10-CM

## 2016-10-14 DIAGNOSIS — D508 Other iron deficiency anemias: Secondary | ICD-10-CM | POA: Diagnosis not present

## 2016-10-14 DIAGNOSIS — D509 Iron deficiency anemia, unspecified: Secondary | ICD-10-CM

## 2016-10-14 LAB — CBC WITH DIFFERENTIAL (CANCER CENTER ONLY)
BASO#: 0 10*3/uL (ref 0.0–0.2)
BASO%: 0.6 % (ref 0.0–2.0)
EOS ABS: 0.5 10*3/uL (ref 0.0–0.5)
EOS%: 7.8 % — ABNORMAL HIGH (ref 0.0–7.0)
HEMATOCRIT: 41 % (ref 34.8–46.6)
HEMOGLOBIN: 13.9 g/dL (ref 11.6–15.9)
LYMPH#: 1.1 10*3/uL (ref 0.9–3.3)
LYMPH%: 16.5 % (ref 14.0–48.0)
MCH: 30.9 pg (ref 26.0–34.0)
MCHC: 33.9 g/dL (ref 32.0–36.0)
MCV: 91 fL (ref 81–101)
MONO#: 0.9 10*3/uL (ref 0.1–0.9)
MONO%: 13.5 % — ABNORMAL HIGH (ref 0.0–13.0)
NEUT%: 61.6 % (ref 39.6–80.0)
NEUTROS ABS: 3.9 10*3/uL (ref 1.5–6.5)
Platelets: 317 10*3/uL (ref 145–400)
RBC: 4.5 10*6/uL (ref 3.70–5.32)
RDW: 12.2 % (ref 11.1–15.7)
WBC: 6.4 10*3/uL (ref 3.9–10.0)

## 2016-10-14 LAB — IRON AND TIBC
%SAT: 30 % (ref 21–57)
Iron: 93 ug/dL (ref 41–142)
TIBC: 309 ug/dL (ref 236–444)
UIBC: 216 ug/dL (ref 120–384)

## 2016-10-14 LAB — FERRITIN: Ferritin: 88 ng/ml (ref 9–269)

## 2016-10-14 NOTE — Progress Notes (Signed)
Hematology and Oncology Follow Up Visit  Jasmin Hughes [id number] Jul 10, 1945 71 y.o. 10/14/2016   Principle Diagnosis:  Iron deficiency anemia secondary to malabsorption  Current Therapy:   IV iron as indicated - last received in Feb/March x 2 doses    Interim History:  Jasmin Hughes is here today for follow-up. She is doing quite well. She had a very nice summer. She and some of her family will be going to the [place] in 2 weeks for a cruise. She is looking forward to this. She is oriented been on 2 or 3 cruises already this year.  Back: Last saw her in June, her ferritin was 97 with an iron saturation of 35%.  She's had no bleeding. She's had no nausea or vomiting. She's had no cough. She's had no change in bowel or bladder habits. There's been no rashes. She's had no leg swelling.  I told her that she must wear sunscreen on a cruise.  Overall, her performance status is ECOG 0   Medications:  Allergies as of 10/14/2016      Reactions   Erythromycin Itching      Medication List       Accurate as of 10/14/16 11:20 AM. Always use your most recent med list.          doxepin 25 MG capsule Commonly known as:  SINEQUAN Take 25 mg by mouth every evening. As needed.   ranitidine 300 MG tablet Commonly known as:  ZANTAC Take 300 mg by mouth every evening.   venlafaxine 37.5 MG tablet Commonly known as:  EFFEXOR Take 37.5 mg by mouth every evening.       Allergies:  Allergies  Allergen Reactions  . Erythromycin Itching    Past Medical History, Surgical history, Social history, and Family History were reviewed and updated.  Review of Systems: As stated in the interim history   Physical Exam:  weight is 137 lb (62.1 kg). Her oral temperature is 98.5 F (36.9 C). Her blood pressure is 151/83 (abnormal) and her pulse is 79. Her respiration is 16 and oxygen saturation is 95%.   Wt Readings from Last 3 Encounters:  10/14/16 137 lb (62.1 kg)    06/16/16 138 lb (62.6 kg)  04/16/16 145 lb (65.8 kg)    Well-developed and well-nourished white female. Head and neck exam shows no ocular or oral lesions. There are no palpable cervical or supraclavicular lymph nodes. Lungs are clear bilaterally. Cardiac exam regular rate and rhythm with no murmurs, rubs or bruits. Abdomen is soft. She has good bowel sounds. There is no fluid wave. There is no palpable liver or spleen tip. Back exam shows no tenderness over the spine, ribs or hips. Extremities shows no clubbing, cyanosis or edema. Skin exam shows no rashes, ecchymoses or petechia.    Lab Results  Component Value Date   WBC 6.4 10/14/2016   HGB 13.9 10/14/2016   HCT 41.0 10/14/2016   MCV 91 10/14/2016   PLT 317 10/14/2016   Lab Results  Component Value Date   FERRITIN 97 06/16/2016   IRON 100 06/16/2016   TIBC 288 06/16/2016   UIBC 187 06/16/2016   IRONPCTSAT 35 06/16/2016   Lab Results  Component Value Date   RBC 4.50 10/14/2016   No results found for: KPAFRELGTCHN, LAMBDASER, KAPLAMBRATIO No results found for: IGGSERUM, IGA, IGMSERUM No results found for: TOTALPROTELP, ALBUMINELP, A1GS, A2GS, BETS, BETA2SER, GAMS, MSPIKE, SPEI   Chemistry   No results found for: NA,  K, CL, CO2, BUN, CREATININE, GLU No results found for: CALCIUM, ALKPHOS, AST, ALT, BILITOT    Impression and Plan: Jasmin Hughes is a very pleasant 71 yo White female. She has a history of iron deficiency. I have to believe that her iron levels should be okay. Her hemoglobin keeps trending up ward.  For now, I think we get her back in 6 months.  If her iron level is low, we will make sure that she gets iron before she goes on her cruise in 2 weeks.  As always, is a lot of fun talking to Mrs. Spagnolo.  Volanda Napoleon, MD 10/4/201811:20 AM

## 2016-10-15 LAB — RETICULOCYTES: Reticulocyte Count: 1.1 % (ref 0.6–2.6)

## 2016-10-20 DIAGNOSIS — K219 Gastro-esophageal reflux disease without esophagitis: Secondary | ICD-10-CM | POA: Diagnosis not present

## 2016-10-20 DIAGNOSIS — R69 Illness, unspecified: Secondary | ICD-10-CM | POA: Diagnosis not present

## 2016-10-20 DIAGNOSIS — G3184 Mild cognitive impairment, so stated: Secondary | ICD-10-CM

## 2016-10-20 DIAGNOSIS — E782 Mixed hyperlipidemia: Secondary | ICD-10-CM | POA: Diagnosis not present

## 2016-10-20 DIAGNOSIS — M150 Primary generalized (osteo)arthritis: Secondary | ICD-10-CM | POA: Diagnosis not present

## 2016-10-20 HISTORY — DX: Mild cognitive impairment of uncertain or unknown etiology: G31.84

## 2016-11-02 ENCOUNTER — Emergency Department (HOSPITAL_BASED_OUTPATIENT_CLINIC_OR_DEPARTMENT_OTHER): Payer: Medicare HMO

## 2016-11-02 ENCOUNTER — Encounter (HOSPITAL_BASED_OUTPATIENT_CLINIC_OR_DEPARTMENT_OTHER): Payer: Self-pay | Admitting: *Deleted

## 2016-11-02 ENCOUNTER — Inpatient Hospital Stay (HOSPITAL_COMMUNITY): Payer: Medicare HMO

## 2016-11-02 ENCOUNTER — Inpatient Hospital Stay (HOSPITAL_BASED_OUTPATIENT_CLINIC_OR_DEPARTMENT_OTHER)
Admission: EM | Admit: 2016-11-02 | Discharge: 2016-11-05 | DRG: 190 | Disposition: A | Discharge status: Home / Self Care | Payer: Medicare HMO | Attending: Internal Medicine | Admitting: Internal Medicine

## 2016-11-02 DIAGNOSIS — J9621 Acute and chronic respiratory failure with hypoxia: Secondary | ICD-10-CM | POA: Diagnosis present

## 2016-11-02 DIAGNOSIS — Z79899 Other long term (current) drug therapy: Secondary | ICD-10-CM | POA: Diagnosis not present

## 2016-11-02 DIAGNOSIS — D72829 Elevated white blood cell count, unspecified: Secondary | ICD-10-CM | POA: Diagnosis present

## 2016-11-02 DIAGNOSIS — F17210 Nicotine dependence, cigarettes, uncomplicated: Secondary | ICD-10-CM | POA: Diagnosis present

## 2016-11-02 DIAGNOSIS — D508 Other iron deficiency anemias: Secondary | ICD-10-CM | POA: Diagnosis present

## 2016-11-02 DIAGNOSIS — D509 Iron deficiency anemia, unspecified: Secondary | ICD-10-CM | POA: Diagnosis present

## 2016-11-02 DIAGNOSIS — Z8673 Personal history of transient ischemic attack (TIA), and cerebral infarction without residual deficits: Secondary | ICD-10-CM | POA: Diagnosis not present

## 2016-11-02 DIAGNOSIS — Z66 Do not resuscitate: Secondary | ICD-10-CM | POA: Diagnosis present

## 2016-11-02 DIAGNOSIS — J449 Chronic obstructive pulmonary disease, unspecified: Secondary | ICD-10-CM

## 2016-11-02 DIAGNOSIS — R0609 Other forms of dyspnea: Secondary | ICD-10-CM

## 2016-11-02 DIAGNOSIS — R0602 Shortness of breath: Secondary | ICD-10-CM | POA: Diagnosis not present

## 2016-11-02 DIAGNOSIS — Z9102 Food additives allergy status: Secondary | ICD-10-CM

## 2016-11-02 DIAGNOSIS — T380X5A Adverse effect of glucocorticoids and synthetic analogues, initial encounter: Secondary | ICD-10-CM | POA: Diagnosis not present

## 2016-11-02 DIAGNOSIS — Z8249 Family history of ischemic heart disease and other diseases of the circulatory system: Secondary | ICD-10-CM

## 2016-11-02 DIAGNOSIS — R7989 Other specified abnormal findings of blood chemistry: Secondary | ICD-10-CM | POA: Diagnosis not present

## 2016-11-02 DIAGNOSIS — Z881 Allergy status to other antibiotic agents status: Secondary | ICD-10-CM | POA: Diagnosis not present

## 2016-11-02 DIAGNOSIS — I248 Other forms of acute ischemic heart disease: Secondary | ICD-10-CM | POA: Diagnosis not present

## 2016-11-02 DIAGNOSIS — I4891 Unspecified atrial fibrillation: Secondary | ICD-10-CM | POA: Diagnosis present

## 2016-11-02 DIAGNOSIS — R0902 Hypoxemia: Secondary | ICD-10-CM

## 2016-11-02 DIAGNOSIS — R69 Illness, unspecified: Secondary | ICD-10-CM | POA: Diagnosis not present

## 2016-11-02 DIAGNOSIS — Z720 Tobacco use: Secondary | ICD-10-CM

## 2016-11-02 DIAGNOSIS — I480 Paroxysmal atrial fibrillation: Secondary | ICD-10-CM | POA: Insufficient documentation

## 2016-11-02 DIAGNOSIS — J441 Chronic obstructive pulmonary disease with (acute) exacerbation: Principal | ICD-10-CM | POA: Diagnosis present

## 2016-11-02 HISTORY — DX: Unspecified atrial fibrillation: I48.91

## 2016-11-02 HISTORY — DX: Chronic obstructive pulmonary disease, unspecified: J44.9

## 2016-11-02 HISTORY — DX: Chronic obstructive pulmonary disease with (acute) exacerbation: J44.1

## 2016-11-02 LAB — BASIC METABOLIC PANEL
Anion gap: 9 (ref 5–15)
BUN: 14 mg/dL (ref 6–20)
CALCIUM: 8.5 mg/dL — AB (ref 8.9–10.3)
CO2: 27 mmol/L (ref 22–32)
CREATININE: 0.7 mg/dL (ref 0.44–1.00)
Chloride: 102 mmol/L (ref 101–111)
GFR calc non Af Amer: >60 mL/min (ref >60)
Glucose, Bld: 148 mg/dL — ABNORMAL HIGH (ref 65–99)
Potassium: 3.3 mmol/L — ABNORMAL LOW (ref 3.5–5.1)
SODIUM: 138 mmol/L (ref 135–145)

## 2016-11-02 LAB — CBC WITH DIFFERENTIAL/PLATELET
BASOS PCT: 0 %
Basophils Absolute: 0 10*3/uL (ref 0.0–0.1)
EOS ABS: 0.3 10*3/uL (ref 0.0–0.7)
Eosinophils Relative: 3 %
HEMATOCRIT: 40.7 % (ref 36.0–46.0)
Hemoglobin: 13.4 g/dL (ref 12.0–15.0)
Lymphocytes Relative: 24 %
Lymphs Abs: 2 10*3/uL (ref 0.7–4.0)
MCH: 29.9 pg (ref 26.0–34.0)
MCHC: 32.9 g/dL (ref 30.0–36.0)
MCV: 90.8 fL (ref 78.0–100.0)
MONO ABS: 0.8 10*3/uL (ref 0.1–1.0)
MONOS PCT: 9 %
Neutro Abs: 5.5 10*3/uL (ref 1.7–7.7)
Neutrophils Relative %: 64 %
Platelets: 339 10*3/uL (ref 150–400)
RBC: 4.48 MIL/uL (ref 3.87–5.11)
RDW: 12.4 % (ref 11.5–15.5)
WBC: 8.5 10*3/uL (ref 4.0–10.5)

## 2016-11-02 LAB — I-STAT ARTERIAL BLOOD GAS, ED
ACID-BASE EXCESS: 1 mmol/L (ref 0.0–2.0)
Acid-Base Excess: 3 mmol/L — ABNORMAL HIGH (ref 0.0–2.0)
BICARBONATE: 30.6 mmol/L — AB (ref 20.0–28.0)
BICARBONATE: 26.6 mmol/L (ref 20.0–28.0)
O2 Saturation: 50 %
O2 Saturation: 89 %
PCO2 ART: 45.5 mmHg (ref 32.0–48.0)
PO2 ART: 30 mmHg — AB (ref 83.0–108.0)
PO2 ART: 57 mmHg — AB (ref 83.0–108.0)
Patient temperature: 98
TCO2: 32 mmol/L (ref 22–32)
TCO2: 28 mmol/L (ref 22–32)
pCO2 arterial: 61.4 mmHg — ABNORMAL HIGH (ref 32.0–48.0)
pH, Arterial: 7.304 — ABNORMAL LOW (ref 7.350–7.450)
pH, Arterial: 7.374 (ref 7.350–7.450)

## 2016-11-02 LAB — PROTIME-INR
INR: 0.92
PROTHROMBIN TIME: 12.3 s (ref 11.4–15.2)

## 2016-11-02 LAB — TROPONIN I
Troponin I: <0.03 ng/mL (ref <0.03)
Troponin I: 0.03 ng/mL (ref <0.03)

## 2016-11-02 LAB — ECHOCARDIOGRAM COMPLETE
HEIGHTINCHES: 61 in
Weight: 2271.62 oz

## 2016-11-02 LAB — BRAIN NATRIURETIC PEPTIDE: B NATRIURETIC PEPTIDE 5: 38.5 pg/mL (ref 0.0–100.0)

## 2016-11-02 LAB — MRSA PCR SCREENING: MRSA BY PCR: NEGATIVE

## 2016-11-02 LAB — TSH: TSH: 0.564 u[IU]/mL (ref 0.350–4.500)

## 2016-11-02 LAB — D-DIMER, QUANTITATIVE: D-Dimer, Quant: 1.58 ug/mL-FEU — ABNORMAL HIGH (ref 0.00–0.50)

## 2016-11-02 MED ORDER — DEXTROSE 5 % IV SOLN
INTRAVENOUS | Status: AC
  Filled 2016-11-02: qty 100

## 2016-11-02 MED ORDER — SODIUM CHLORIDE 0.9% FLUSH: 3.0000 mL | INTRAVENOUS | Status: DC | PRN

## 2016-11-02 MED ORDER — METHYLPREDNISOLONE SODIUM SUCC 125 MG IJ SOLR
60.0000 mg | Freq: Four times a day (QID) | INTRAMUSCULAR | Status: DC
  Administered 2016-11-02 – 2016-11-03 (×4): 60 mg via INTRAVENOUS
  Filled 2016-11-02 (×3): qty 2

## 2016-11-02 MED ORDER — DILTIAZEM HCL ER COATED BEADS 120 MG PO CP24
120.0000 mg | ORAL_CAPSULE | Freq: Every day | ORAL | Status: DC
  Administered 2016-11-03 – 2016-11-05 (×3): 120 mg via ORAL
  Filled 2016-11-02 (×3): qty 1

## 2016-11-02 MED ORDER — DILTIAZEM HCL 60 MG PO TABS
60.0000 mg | ORAL_TABLET | Freq: Three times a day (TID) | ORAL | Status: AC
  Administered 2016-11-02 (×2): 60 mg via ORAL
  Filled 2016-11-02 (×2): qty 1

## 2016-11-02 MED ORDER — METOPROLOL TARTRATE 25 MG PO TABS
25.0000 mg | ORAL_TABLET | Freq: Two times a day (BID) | ORAL | Status: DC
  Administered 2016-11-02 – 2016-11-05 (×7): 25 mg via ORAL
  Filled 2016-11-02 (×7): qty 1

## 2016-11-02 MED ORDER — HEPARIN BOLUS VIA INFUSION
3300.0000 [IU] | Freq: Once | INTRAVENOUS | Status: AC
  Administered 2016-11-02: 3300 [IU] via INTRAVENOUS
  Filled 2016-11-02: qty 3300

## 2016-11-02 MED ORDER — ACETAMINOPHEN 650 MG RE SUPP: 650.0000 mg | Freq: Four times a day (QID) | RECTAL | Status: DC | PRN

## 2016-11-02 MED ORDER — FAMOTIDINE 20 MG PO TABS
20.0000 mg | ORAL_TABLET | Freq: Every day | ORAL | Status: DC
  Administered 2016-11-02 – 2016-11-05 (×4): 20 mg via ORAL
  Filled 2016-11-02 (×4): qty 1

## 2016-11-02 MED ORDER — IPRATROPIUM BROMIDE 0.02 % IN SOLN
1.0000 mg | Freq: Once | RESPIRATORY_TRACT | Status: AC
  Administered 2016-11-02: 1 mg via RESPIRATORY_TRACT
  Filled 2016-11-02: qty 5

## 2016-11-02 MED ORDER — HYDROCODONE-ACETAMINOPHEN 5-325 MG PO TABS
1.0000 | ORAL_TABLET | ORAL | Status: DC | PRN
  Administered 2016-11-02 – 2016-11-03 (×2): 2 via ORAL
  Filled 2016-11-02 (×2): qty 2

## 2016-11-02 MED ORDER — DOXEPIN HCL 25 MG PO CAPS
25.0000 mg | ORAL_CAPSULE | Freq: Every evening | ORAL | Status: DC | PRN
  Administered 2016-11-02: 25 mg via ORAL
  Filled 2016-11-02 (×2): qty 1

## 2016-11-02 MED ORDER — METHYLPREDNISOLONE SODIUM SUCC 125 MG IJ SOLR
125.0000 mg | Freq: Once | INTRAMUSCULAR | Status: AC
  Administered 2016-11-02: 125 mg via INTRAVENOUS
  Filled 2016-11-02: qty 2

## 2016-11-02 MED ORDER — SODIUM CHLORIDE 0.9% FLUSH
3.0000 mL | Freq: Two times a day (BID) | INTRAVENOUS | Status: DC
  Administered 2016-11-02 – 2016-11-04 (×6): 3 mL via INTRAVENOUS

## 2016-11-02 MED ORDER — ONDANSETRON HCL 4 MG/2ML IJ SOLN: 4.0000 mg | Freq: Four times a day (QID) | INTRAMUSCULAR | Status: DC | PRN

## 2016-11-02 MED ORDER — SODIUM CHLORIDE 0.9 % IV SOLN: 250.0000 mL | INTRAVENOUS | Status: DC | PRN

## 2016-11-02 MED ORDER — DILTIAZEM HCL 100 MG IV SOLR
5.0000 mg/h | INTRAVENOUS | Status: DC
  Administered 2016-11-02: 5 mg/h via INTRAVENOUS

## 2016-11-02 MED ORDER — ENOXAPARIN SODIUM 40 MG/0.4ML ~~LOC~~ SOLN: 40.0000 mg | SUBCUTANEOUS | Status: DC

## 2016-11-02 MED ORDER — DILTIAZEM LOAD VIA INFUSION
10.0000 mg | Freq: Once | INTRAVENOUS | Status: AC
  Administered 2016-11-02: 10 mg via INTRAVENOUS
  Filled 2016-11-02: qty 10

## 2016-11-02 MED ORDER — IOPAMIDOL (ISOVUE-370) INJECTION 76%
100.0000 mL | Freq: Once | INTRAVENOUS | Status: AC | PRN
  Administered 2016-11-02: 100 mL via INTRAVENOUS

## 2016-11-02 MED ORDER — IPRATROPIUM BROMIDE 0.02 % IN SOLN: 0.5000 mg | Freq: Four times a day (QID) | RESPIRATORY_TRACT | Status: DC

## 2016-11-02 MED ORDER — POTASSIUM CHLORIDE CRYS ER 20 MEQ PO TBCR
40.0000 meq | EXTENDED_RELEASE_TABLET | Freq: Once | ORAL | Status: AC
  Administered 2016-11-02: 40 meq via ORAL
  Filled 2016-11-02: qty 2

## 2016-11-02 MED ORDER — ACETAMINOPHEN 325 MG PO TABS
650.0000 mg | ORAL_TABLET | Freq: Four times a day (QID) | ORAL | Status: DC | PRN
  Administered 2016-11-04: 650 mg via ORAL
  Filled 2016-11-02: qty 2

## 2016-11-02 MED ORDER — METOPROLOL TARTRATE 5 MG/5ML IV SOLN
2.5000 mg | Freq: Once | INTRAVENOUS | Status: AC
  Administered 2016-11-02: 2.5 mg via INTRAVENOUS
  Filled 2016-11-02: qty 5

## 2016-11-02 MED ORDER — ALBUTEROL SULFATE (2.5 MG/3ML) 0.083% IN NEBU: 2.5000 mg | INHALATION_SOLUTION | Freq: Four times a day (QID) | RESPIRATORY_TRACT | Status: DC

## 2016-11-02 MED ORDER — METOPROLOL TARTRATE 5 MG/5ML IV SOLN: 5.0000 mg | INTRAVENOUS | Status: DC | PRN

## 2016-11-02 MED ORDER — ONDANSETRON HCL 4 MG PO TABS: 4.0000 mg | ORAL_TABLET | Freq: Four times a day (QID) | ORAL | Status: DC | PRN

## 2016-11-02 MED ORDER — ALBUTEROL (5 MG/ML) CONTINUOUS INHALATION SOLN
15.0000 mg/h | INHALATION_SOLUTION | RESPIRATORY_TRACT | Status: AC
  Administered 2016-11-02: 15 mg/h via RESPIRATORY_TRACT
  Filled 2016-11-02: qty 20

## 2016-11-02 MED ORDER — IPRATROPIUM-ALBUTEROL 0.5-2.5 (3) MG/3ML IN SOLN
3.0000 mL | Freq: Four times a day (QID) | RESPIRATORY_TRACT | Status: DC
  Administered 2016-11-02 – 2016-11-04 (×10): 3 mL via RESPIRATORY_TRACT
  Filled 2016-11-02 (×10): qty 3

## 2016-11-02 MED ORDER — HEPARIN (PORCINE) IN NACL 100-0.45 UNIT/ML-% IJ SOLN
850.0000 [IU]/h | INTRAMUSCULAR | Status: DC
  Administered 2016-11-02: 950 [IU]/h via INTRAVENOUS
  Filled 2016-11-02: qty 250

## 2016-11-02 MED ORDER — ALBUTEROL SULFATE (2.5 MG/3ML) 0.083% IN NEBU: 2.5000 mg | INHALATION_SOLUTION | RESPIRATORY_TRACT | Status: DC | PRN

## 2016-11-02 NOTE — Progress Notes (Signed)
Pt went into Afib. CAT stopped.

## 2016-11-02 NOTE — Consult Note (Signed)
Cardiology Consultation:   Patient ID: Jasmin Hughes; [id number]; [birth year]   Admit date: 11/02/2016 Date of Consult: 11/02/2016  Primary Care Provider: Maylon Peppers, MD Primary Cardiologist: New- Dr Oval Linsey   Patient Profile:   Jasmin Hughes is a 71 y.o. female with a hx of iron deficiency anemia, smoking, bipolar affective disorder, HLD, GERD and osteoarthritis who is being seen today for the evaluation of atrial fibrillation with RVR at the request of Dr. Laren Everts.  History of Present Illness:   Jasmin Hughes went to bed coughing last night and awoke during the night with shortness of breath. She recently returned from a cruise in [place]. D dimer was elevated. Chest CT in ED was negative for PE. CXR shows no acute processes. While in the ED she developed atrial fibrillation with RVR. She had some associated chest tightness. Cardizem drip was started and she was given lopressor IV. She has converted to sinus rhythm now with rates in the 90's.   Pt states that she has been feeling bad since her trip to [place] a little over a week ago. She was coughing a lot yesterday and felt tired. She vomited X1. She went to bed early. She woke around 4am feeling short of breath, lightheaded and coughing. She went to take a shower to open up her lungs and then felt like she was going to pass out. She went to [place] ED and was found to have afib RVR.   Currently pt is not having chest pain or shortness of breath. She does say that she has been having some intermittent tightness across her chest that lasts a few minutes and occurs at rest or with activity. She has also had some shortness of breath with her more exertional house work that has been worse in the last few months. On her vacation to [place] she had some dizziness. She denies palpitations. She has had a normal cardiac cath over 10 years ago, otherwise no cardiac history. Of note she had a "mini-stroke" about 6 months ago.   She  smokes at least 5 cigarettes per day. No alcohol. Her father had CAD with Bypass in his 24's and he also had afib.   TSH is pending  BNP  38.5 Troponin <0.03 K+ 3.3 SCr 0.70 Hgb  13.4,   WBCs 8.5  Past Medical History:  Diagnosis Date  . Iron deficiency anemia 03/04/2016  . 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 No pertinent surgical history.   Home Medications:  Prior to Admission medications   Medication Sig Start Date End Date Taking? Authorizing Provider  doxepin (SINEQUAN) 25 MG capsule Take 25 mg by mouth every evening. As needed. 02/11/15  Yes [provider]  ibuprofen (ADVIL,MOTRIN) 800 MG tablet Take 800 mg by mouth every 8 (eight) hours as needed.   Yes [provider]  naproxen sodium (ANAPROX) 220 MG tablet Take 440 mg by mouth daily as needed.   Yes [provider]  ranitidine (ZANTAC) 300 MG tablet Take 300 mg by mouth every evening.  07/29/15 11/02/16 Yes [provider]  venlafaxine (EFFEXOR) 37.5 MG tablet Take 37.5 mg by mouth every evening.  07/29/15  Yes [provider]    Inpatient Medications: Scheduled Meds: . doxepin  25 mg Oral QPM  . enoxaparin (LOVENOX) injection  40 mg Subcutaneous Q24H  . famotidine  20 mg Oral Daily  . ipratropium-albuterol  3 mL Nebulization Q6H  . methylPREDNISolone (SOLU-MEDROL) injection  60  mg Intravenous Q6H  . metoprolol tartrate  25 mg Oral BID  . sodium chloride flush  3 mL Intravenous Q12H   Continuous Infusions: . sodium chloride    . diltiazem (CARDIZEM) infusion 10 mg/hr (11/02/16 1000)   PRN Meds: sodium chloride, acetaminophen **OR** acetaminophen, albuterol, HYDROcodone-acetaminophen, metoprolol tartrate, ondansetron **OR** ondansetron (ZOFRAN) IV, sodium chloride flush  Allergies:    Allergies  Allergen Reactions  . Other Nausea And Vomiting    CURRY  . Erythromycin Itching    Social History:   Social History   Social History  . Marital status:  Married    Spouse name: N/A  . Number of children: N/A  . Years of education: N/A   Occupational History  . Not on file.   Social History Main Topics  . Smoking status: Current Every Day Smoker    Packs/day: 0.25  . Smokeless tobacco: Never Used  . Alcohol use No  . Drug use: No  . Sexual activity: Not on file   Other Topics Concern  . Not on file   Social History Narrative  . No narrative on file    Family History:    Family History  Problem Relation Age of Onset  . Stomach cancer Mother   . Hypertension Mother   . CAD Father        MI, CABG in his 66's  . Lupus Sister   . Hypertension Sister   . Hyperlipidemia Sister   . Hyperlipidemia Brother   . Mitral valve prolapse Brother   . Hyperlipidemia Sister   . Hypertension Sister   . Heart murmur Sister   . Hypertension Son      ROS:  Please see the history of present illness.  ROS  All other ROS reviewed and negative.     Physical Exam/Data:   Vitals:   11/02/16 0859 11/02/16 1000 11/02/16 1100 11/02/16 1200  BP:  114/71 121/67   Pulse: (!) 141 (!) 153    Resp: (!) 23 (!) 25 19   Temp:    98.1 F (36.7 C)  TempSrc:    Oral  SpO2: 93% 95%    Weight:  141 lb 15.6 oz (64.4 kg)    Height:  5\' 1"  (1.549 m)      Intake/Output Summary (Last 24 hours) at 11/02/16 1246 Last data filed at 11/02/16 1000  Gross per 24 hour  Intake            17.41 ml  Output                0 ml  Net            17.41 ml   Filed Weights   11/02/16 0501 11/02/16 1000  Weight: 135 lb (61.2 kg) 141 lb 15.6 oz (64.4 kg)   Body mass index is 26.83 kg/m.  General:  Well nourished, well developed, in no acute distress HEENT: normal Lymph: no adenopathy Neck: no JVD Endocrine:  No thryomegaly Vascular: No carotid bruits; FA pulses 2+ bilaterally without bruits  Cardiac:  normal S1, S2; RRR; no murmur Lungs:  clear to auscultation bilaterally, no wheezing, rhonchi or rales  Abd: soft, nontender, no hepatomegaly  Ext: no  edema Musculoskeletal:  No deformities, BUE and BLE strength normal and equal Skin: warm and dry  Neuro:  CNs 2-12 intact, no focal abnormalities noted Psych:  Normal affect   EKG:  The EKG was personally reviewed and demonstrates:  At 0522: sinus tach at 107  bpm, RAE, no acute iscemia.  At 0823: afib at 171 bpm, Repolarization abnormality, prob rate related Telemetry:  Telemetry was personally reviewed and demonstrates:  Was afib in the 150's, converted to sinus rhythm in the 90's.  Relevant CV Studies:  Echo pending  Laboratory Data:  Chemistry  Recent Labs Lab 11/02/16 0511  NA 138  K 3.3*  CL 102  CO2 27  GLUCOSE 148*  BUN 14  CREATININE 0.70  CALCIUM 8.5*  GFRNONAA >60  GFRAA >60  ANIONGAP 9    No results for input(s): PROT, ALBUMIN, AST, ALT, ALKPHOS, BILITOT in the last 168 hours. Hematology  Recent Labs Lab 11/02/16 0511  WBC 8.5  RBC 4.48  HGB 13.4  HCT 40.7  MCV 90.8  MCH 29.9  MCHC 32.9  RDW 12.4  PLT 339   Cardiac Enzymes  Recent Labs Lab 11/02/16 0511  TROPONINI <0.03   No results for input(s): TROPIPOC in the last 168 hours.  BNP  Recent Labs Lab 11/02/16 0511  BNP 38.5    DDimer   Recent Labs Lab 11/02/16 0511  DDIMER 1.58*    Radiology/Studies:  Ct Angio Chest Pe W And/or Wo Contrast  Result Date: 11/02/2016 CLINICAL DATA:  Acute onset of severe shortness of breath. Elevated D-dimer. Iron deficiency anemia. Initial encounter. EXAM: CT ANGIOGRAPHY CHEST WITH CONTRAST TECHNIQUE: Multidetector CT imaging of the chest was performed using the standard protocol during bolus administration of intravenous contrast. Multiplanar CT image reconstructions and MIPs were obtained to evaluate the vascular anatomy. CONTRAST:  100 mL of Isovue 370 IV contrast COMPARISON:  Chest radiograph performed earlier today at 5:05 a.m. FINDINGS: Cardiovascular:  There is no evidence of pulmonary embolus. The heart remains normal in size. Mild  calcification is noted along the aortic arch. The great vessels are unremarkable in appearance. Mediastinum/Nodes: Scattered coronary artery calcifications are seen. A large hiatal hernia is noted. No mediastinal lymphadenopathy is seen. No pericardial effusion is identified. The thyroid is unremarkable in appearance. No axillary lymphadenopathy is appreciated. Lungs/Pleura: Minimal right basilar atelectasis or scarring is noted. No pleural effusion or pneumothorax is seen. No masses are identified. Upper Abdomen: The visualized portions of the liver and spleen are unremarkable. The visualized portions of the gallbladder, pancreas, adrenal glands and kidneys are within normal limits. Musculoskeletal: No acute osseous abnormalities are identified. The visualized musculature is unremarkable in appearance. Review of the MIP images confirms the above findings. IMPRESSION: 1. No evidence of pulmonary embolus. 2. Minimal right basilar atelectasis or scarring. Lungs otherwise clear. 3. Scattered coronary artery calcifications seen. 4. Large hiatal hernia noted. Electronically Signed   By: Garald Balding M.D.   On: 11/02/2016 06:53   Dg Chest Port 1 View  Result Date: 11/02/2016 CLINICAL DATA:  71 y/o  F; shortness of breath. EXAM: PORTABLE CHEST 1 VIEW COMPARISON:  None. FINDINGS: Normal cardiac silhouette given projection and technique. Large hiatal hernia. No focal consolidation, effusion, or pneumothorax. Bones are unremarkable. IMPRESSION: Large hiatal hernia.  No acute pulmonary process identified. Electronically Signed   By: Kristine Garbe M.D.   On: 11/02/2016 05:48    Assessment and Plan:   1. New onset atrial fibrillation: Unknown duration. With pt feeling bad for a few weeks and intermittent chest tightness and mild DOE for several months and also had a mini stroke about 6 months ago, she may have been having PAF for some time. Came in this am for shortness of breath, coughing, and weakness.  Now on IV  cardizem and converted to sinus rhythm. Continue metoprolol. CHA2DS2/VAS Stroke Risk Score 2 (Age, female). Will initiate anticoagulation for stroke risk reduction. Will start heparin until know if plan for ischemic testing with plan to discharge home on DOAC-Apixaban 5 mg bid. Will check echocardiogram for LV function, valve status and wall motion. Will consider ischemic eval with stress test, can probably be done outpt. Will discuss with Dr. Oval Linsey.  Will check lipids and Hgb A1c for risk stratification.    For questions or updates, please contact [place] Please consult [email or web address] for contact info under Cardiology/STEMI.   Signed, Daune Perch, NP  11/02/2016 12:46 PM

## 2016-11-02 NOTE — ED Provider Notes (Signed)
TIME SEEN: 5:12 AM  CHIEF COMPLAINT: shortness of breath  HPI: Pt is a 71 year old female with history of tobacco use and iron deficiency anemia who presents emergency department with her husband by private vehicle with shortness of breath. Patient found to have oxygen saturations of 77% on room air. Does not wear oxygen at home. States that she went to bed coughing last night and woke up in the middle of the night feeling very short of breath. Is having anterior chest tightness. No known fevers. No vomiting or diarrhea. Has reports they just returned from a cruise in the [place]. States they flew back and forth from [facility]. No other prolonged travel. No history of PE or DVT. No lower extremity swelling or pain. She denies history of asthma or COPD. No recent wheezing. She denies history of heart failure.  ROS: level V caveat on for respiratory distress  PAST MEDICAL HISTORY/PAST SURGICAL HISTORY:  Past Medical History:  Diagnosis Date  . Iron deficiency anemia 03/04/2016  . 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 Authorizing Provider  doxepin (SINEQUAN) 25 MG capsule Take 25 mg by mouth every evening. As needed. 02/11/15   [provider]  ranitidine (ZANTAC) 300 MG tablet Take 300 mg by mouth every evening.  07/29/15 07/28/16  [provider]  venlafaxine (EFFEXOR) 37.5 MG tablet Take 37.5 mg by mouth every evening.  07/29/15   [provider]    ALLERGIES:  Allergies  Allergen Reactions  . Erythromycin Itching    SOCIAL HISTORY:  Social History  Substance Use Topics  . Smoking status: Current Every Day Smoker  . Smokeless tobacco: Never Used  . Alcohol use No    FAMILY HISTORY: History reviewed. No pertinent family history.  EXAM: BP (!) 167/90 (BP Location: Right Arm)   Pulse (!) 112   Resp (!) 36   Ht 5\' 1"  (1.549 m)   Wt 61.2 kg (135 lb)   SpO2 (!) 77%    BMI 25.51 kg/m  CONSTITUTIONAL: Alert and oriented and responds appropriately to questions. Chronically ill-appearing, elderly HEAD: Normocephalic EYES: Conjunctivae clear, pupils appear equal, EOMI ENT: normal nose; moist mucous membranes NECK: Supple, no meningismus, no nuchal rigidity, no LAD  CARD: regular and tachycardic; S1 and S2 appreciated; no murmurs, no clicks, no rubs, no gallops RESP: breath sounds are equal bilaterally but very diminished at bases bilaterally, she does have air movement in the upper part of her lungs but this is also diminished, I do not appreciate wheezing or rhonchi or rales, she is hypoxic on room air, speaking 1-2 words at a time, in moderate to severe respiratory distress, tachypneic ABD/GI: Normal bowel sounds; non-distended; soft, non-tender, no rebound, no guarding, no peritoneal signs, no hepatosplenomegaly BACK:  The back appears normal and is non-tender to palpation, there is no CVA tenderness EXT: Normal ROM in all joints; non-tender to palpation; no edema; normal capillary refill; no cyanosis, no calf tenderness or swelling    SKIN: Normal color for age and race; warm; no rash NEURO: Moves all extremities equally PSYCH: The patient's mood and manner are appropriate. Grooming and personal hygiene are appropriate.  MEDICAL DECISION MAKING: Pt here in respiratory distress. Differential diagnosis includes COPD, pneumothorax, pneumonia, PE, CHF.  EKG shows sinus tachycardia without ischemic abnormality. Patient is having a hard time tolerating a nonrebreather, mask for CAT.  Have attempted to encourage her as I feel she  may benefit from BiPAP. We'll obtain labs, chest x-ray, ABG. Patient will need admission.  Pt is unsure if she would want intubation if needed.  ED PROGRESS: D dimer elevated.  Will perform CTA chest when patient more stable and can lye flat.  CXR shows no PTX or significant edema, infiltrate.  Patient has a large hiatal hernia.  5:45 AM   Pt's aeration seems to be improving and her tachypnea is also improving. Able to speak full sentences. Reports that she is feeling better. ABG pending.   6:00 AM Pt's first ABG likely mixed sample. PH without ABG was 7.3, PCO2 61.4, PO2 30, bicarbonate 30.6. Repeat ABG shows pH of 7.37, PCO2 45.5, PO2 57, bicarbonate 26.6. Suspect that this second collection is a true arterial sample.  Patient's troponin is negative. BNP is 38.5. Will obtain CT of her chest.  7:00 AM  Pt's CT scan shows no pulmonary embolus, infiltrate or edema. Patient has large hiatal hernia again seen on CT imaging. I do not feel this is the cause of her hypoxia. I suspect it is COPD from smoking for over 50 years. Aeration and symptoms improving on continuous albuterol. Patient speaking full senses. She reports feeling much better. Sats 97% on continuous albuterol.  At this time I feel we can hold on BiPAP given significant improvement.  Will discuss with medicine for admission. I feel patient will need stepdown.  7:11 AM Discussed patient's case with hospitalist at [facility], Dr. Laren Everts.  I have recommended admission and patient (and family if present) agree with this plan. Admitting physician will place admission orders.   I reviewed all nursing notes, vitals, pertinent previous records, EKGs, lab and urine results, imaging (as available).    EKG Interpretation  Date/Time:  Tuesday November 02 2016 05:22:48 EDT Ventricular Rate:  107 PR Interval:    QRS Duration: 92 QT Interval:  357 QTC Calculation: 477 R Axis:   73 Text Interpretation:  Sinus tachycardia Right atrial enlargement No old tracing to compare Confirmed by Andry Bogden, Cyril Mourning [phone]) on 11/02/2016 5:30:00 AM         CRITICAL CARE Performed by: Nyra Jabs   Total critical care time: 55 minutes  Critical care time was exclusive of separately billable procedures and treating other patients.  Critical care was necessary to treat or prevent imminent  or life-threatening deterioration.  Critical care was time spent personally by me on the following activities: development of treatment plan with patient and/or surrogate as well as nursing, discussions with consultants, evaluation of patient's response to treatment, examination of patient, obtaining history from patient or surrogate, ordering and performing treatments and interventions, ordering and review of laboratory studies, ordering and review of radiographic studies, pulse oximetry and re-evaluation of patient's condition.     Robbi Scurlock, Delice Bison, DO 11/02/16 [phone]

## 2016-11-02 NOTE — H&P (Addendum)
[facility]                                                                                    Patient Demographics  Jasmin Hughes, is a 71 y.o. female  CSN: [id number]  MRN: [id number]  DOB - 11/19/1945  Admit Date - 11/02/2016  Outpatient Primary MD for the patient is Maylon Peppers, MD   With History of -  Past Medical History:  Diagnosis Date  . Iron deficiency anemia 03/04/2016  . 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 No pertinent surgical history.  in for   Chief Complaint  Patient presents with  . Shortness of Breath     HPI  Jasmin Hughes  is a 71 y.o. female, with past medical history significant for iron deficiency anemia and history of smoking presenting today with one-day history of shortness of breath and hypoxemia. The patient went to bed coughing last night and woke up in the middle of the night short of breath. No history of fever or chills. No history of nausea or vomiting.. The patient just came back from a cruise in the [place]. No history of PE or DVT. Chest CT in the emergency room was negative for PE. Chest x-ray negative for acute process . While in the emergency room the patient developed atial fibrillation with rapid ventricular rate. Patient had some chest tightness and was started on Cardizem drip and lopressor IV PRN.    Review of Systems    In addition to the HPI above,  No Fever-chills, No Headache, No changes with Vision or hearing, No problems swallowing food or Liquids, No Abdominal pain, No Nausea or Vommitting, Bowel movements are regular, No Blood in stool or Urine, No dysuria, No new skin rashes or bruises, No new joints pains-aches,  No new weakness, tingling, numbness in any extremity, No recent weight gain or loss, No polyuria, polydypsia or polyphagia, No significant Mental Stressors.  A full 10 point Review of Systems was done, except as stated above, all other Review of Systems were  negative.   Social History Social History  Substance Use Topics  . Smoking status: Current Every Day Smoker  . Smokeless tobacco: Never Used  . Alcohol use No     Family History History reviewed. No pertinent family history.   Prior to Admission medications   Medication Sig Start Date End Date Taking? Authorizing Provider  doxepin (SINEQUAN) 25 MG capsule Take 25 mg by mouth every evening. As needed. 02/11/15   [provider]  ranitidine (ZANTAC) 300 MG tablet Take 300 mg by mouth every evening.  07/29/15 07/28/16  [provider]  venlafaxine (EFFEXOR) 37.5 MG tablet Take 37.5 mg by mouth every evening.  07/29/15   [provider]    Allergies  Allergen Reactions  . Erythromycin Itching    Physical Exam  Vitals  Blood pressure 114/71, pulse (!) 153, temperature 97.6 F (36.4 C), temperature source Oral, resp. rate (!) 25, height 5\' 1"  (1.549 m), weight 64.4 kg (141 lb 15.6 oz), SpO2 95 %.   1. General  well-developed, well-nourished, extremely pleasant female with mild discomfort  2.  Normal affect and insight, Not Suicidal or Homicidal, Awake Alert, Oriented X 3.  3. No F.N deficits, grossly, patient moving all extremities.  4. Ears and Eyes appear Normal, Conjunctivae clear, PERRLA. Moist Oral Mucosa.  5. Supple Neck, No JVD, No cervical lymphadenopathy appriciated, No Carotid Bruits.  6. Symmetrical Chest wall movement, decreased air entry bilaterally.  7. Irregularly irregular, tachycardic No Gallops, Rubs or Murmurs, No Parasternal Heave.  8. Positive Bowel Sounds, Abdomen Soft, Non tender, No organomegaly appriciated,No rebound -guarding or rigidity.  9.  No Cyanosis, Normal Skin Turgor, No Skin Rash or Bruise.  10. Good muscle tone,  joints appear normal , no effusions, Normal ROM.    Data Review  CBC  Recent Labs Lab 11/02/16 0511  WBC 8.5  HGB 13.4  HCT 40.7  PLT 339  MCV 90.8  MCH 29.9  MCHC 32.9  RDW 12.4   LYMPHSABS 2.0  MONOABS 0.8  EOSABS 0.3  BASOSABS 0.0   ------------------------------------------------------------------------------------------------------------------  Chemistries   Recent Labs Lab 11/02/16 0511  NA 138  K 3.3*  CL 102  CO2 27  GLUCOSE 148*  BUN 14  CREATININE 0.70  CALCIUM 8.5*   ------------------------------------------------------------------------------------------------------------------ estimated creatinine clearance is 55.4 mL/min (by C-G formula based on SCr of 0.7 mg/dL). ------------------------------------------------------------------------------------------------------------------ No results for input(s): TSH, T4TOTAL, T3FREE, THYROIDAB in the last 72 hours.  Invalid input(s): FREET3   Coagulation profile No results for input(s): INR, PROTIME in the last 168 hours. -------------------------------------------------------------------------------------------------------------------  Recent Labs  11/02/16 0511  DDIMER 1.58*   -------------------------------------------------------------------------------------------------------------------  Cardiac Enzymes  Recent Labs Lab 11/02/16 0511  TROPONINI <0.03   ------------------------------------------------------------------------------------------------------------------ Invalid input(s): POCBNP   ---------------------------------------------------------------------------------------------------------------  Urinalysis No results found for: COLORURINE, APPEARANCEUR, LABSPEC, PHURINE, GLUCOSEU, HGBUR, BILIRUBINUR, KETONESUR, PROTEINUR, UROBILINOGEN, NITRITE, LEUKOCYTESUR  ----------------------------------------------------------------------------------------------------------------   Imaging results:   Ct Angio Chest Pe W And/or Wo Contrast  Result Date: 11/02/2016 CLINICAL DATA:  Acute onset of severe shortness of breath. Elevated D-dimer. Iron deficiency anemia. Initial  encounter. EXAM: CT ANGIOGRAPHY CHEST WITH CONTRAST TECHNIQUE: Multidetector CT imaging of the chest was performed using the standard protocol during bolus administration of intravenous contrast. Multiplanar CT image reconstructions and MIPs were obtained to evaluate the vascular anatomy. CONTRAST:  100 mL of Isovue 370 IV contrast COMPARISON:  Chest radiograph performed earlier today at 5:05 a.m. FINDINGS: Cardiovascular:  There is no evidence of pulmonary embolus. The heart remains normal in size. Mild calcification is noted along the aortic arch. The great vessels are unremarkable in appearance. Mediastinum/Nodes: Scattered coronary artery calcifications are seen. A large hiatal hernia is noted. No mediastinal lymphadenopathy is seen. No pericardial effusion is identified. The thyroid is unremarkable in appearance. No axillary lymphadenopathy is appreciated. Lungs/Pleura: Minimal right basilar atelectasis or scarring is noted. No pleural effusion or pneumothorax is seen. No masses are identified. Upper Abdomen: The visualized portions of the liver and spleen are unremarkable. The visualized portions of the gallbladder, pancreas, adrenal glands and kidneys are within normal limits. Musculoskeletal: No acute osseous abnormalities are identified. The visualized musculature is unremarkable in appearance. Review of the MIP images confirms the above findings. IMPRESSION: 1. No evidence of pulmonary embolus. 2. Minimal right basilar atelectasis or scarring. Lungs otherwise clear. 3. Scattered coronary artery calcifications seen. 4. Large hiatal hernia noted. Electronically Signed   By: Garald Balding M.D.   On: 11/02/2016 06:53   Dg Chest Port 1 View  Result Date: 11/02/2016 CLINICAL DATA:  71 y/o  F; shortness of breath. EXAM: PORTABLE CHEST  1 VIEW COMPARISON:  None. FINDINGS: Normal cardiac silhouette given projection and technique. Large hiatal hernia. No focal consolidation, effusion, or pneumothorax. Bones are  unremarkable. IMPRESSION: Large hiatal hernia.  No acute pulmonary process identified. Electronically Signed   By: Kristine Garbe M.D.   On: 11/02/2016 05:48    My personal review of EKG: A. fib with rapid ventricular rate at 171 bpm  Assessment & Plan  1. New onset A. fib with rapid ventricular rate         Check TSH     Echocardiogram     Consult cardiology     Cardizem drip     Lopressor by mouth     IV Lopressor when necessary  2. COPD exacerbation     Solu-Medrol     Nebulizer treatments   DVT Prophylaxis Lovenox  AM Labs Ordered, also please review Full Orders  Family Communication: Admission, patients condition and plan of care including tests being ordered have been discussed with the patient and husband who indicate understanding and agree with the plan and Code Status.  Code Status full  Disposition Plan: home  Time spent in minutes : 42 min  Condition GUARDED   @SIGNATURE @

## 2016-11-02 NOTE — ED Notes (Signed)
ED Provider at bedside. 

## 2016-11-02 NOTE — ED Provider Notes (Signed)
0830 Called to bedside for rapid heart rate.  Patient complaining of generalized body aches.  She is noted to be in rapid atrial fibrillation 170s-180s.  Discontinued continuous neb, provided potassium, IV diltiazem bolus and drip.  HR mildly improved (140s-160s). Provided additional 2.5 of lopressor with improvement in HR to 110s-130s.  Plan to transfer to [facility] for further treatment.  Dr. Laren Everts regarding change in patient status.     Quintella Reichert, MD 11/02/16 [phone]

## 2016-11-02 NOTE — Progress Notes (Addendum)
ANTICOAGULATION CONSULT NOTE - Initial Consult  Pharmacy Consult for IV heparin Indication: atrial fibrillation  Allergies  Allergen Reactions  . Other Nausea And Vomiting    CURRY  . Erythromycin Itching    Patient Measurements: Height: 5\' 1"  (154.9 cm) Weight: 141 lb 15.6 oz (64.4 kg) IBW/kg (Calculated) : 47.8 Heparin Dosing Weight:   Vital Signs: Temp: 98.1 F (36.7 C) (10/23 1200) Temp Source: Oral (10/23 1200) BP: 121/67 (10/23 1100) Pulse Rate: 153 (10/23 1000)  Labs:  Recent Labs  11/02/16 0511  HGB 13.4  HCT 40.7  PLT 339  CREATININE 0.70  TROPONINI <0.03    Estimated Creatinine Clearance: 55.4 mL/min (by C-G formula based on SCr of 0.7 mg/dL).   Medical History: Past Medical History:  Diagnosis Date  . Iron deficiency anemia 03/04/2016  . 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 famotidine  20 mg Oral Daily  . heparin  3,300 Units Intravenous Once  . ipratropium-albuterol  3 mL Nebulization Q6H  . methylPREDNISolone (SOLU-MEDROL) injection  60 mg Intravenous Q6H  . metoprolol tartrate  25 mg Oral BID  . sodium chloride flush  3 mL Intravenous Q12H    Assessment: Pharmacy is consulted to dose heparin in 71 yo female diagnosed with new onset atrial fibrillation. PMH includes iron deficiency anemia, smoking, bipolar affective disorder, HLD, GERD and osteoarthritis. Pt reports feeling bad for a few weeks and intermittent chest tightness and mild DOE for several months and also had a mini stroke about 6 months ago, she may have been having PAF for some time.   Currently plan is to convert to apixaban 5 mg PO BID upon discharge.  Today's labs, 11/02/16   Hgb 13.4, Plt 339   Scr 0.70, CrCl 55 ml/min   PT 12.3, INR 0.92   Goal of Therapy:  Heparin level 0.3-0.7 units/ml Monitor platelets by anticoagulation protocol: Yes   Plan:  Give 3300 units bolus x 1 Start heparin infusion at 950 units/hr  Check HL 8 hours after  heparin drip start Monitor for signs and symptoms of bleeding F/u about transition to apixaban after stress test.    Royetta Asal, PharmD, BCPS Pager [phone] 11/02/2016 3:20 PM

## 2016-11-02 NOTE — Progress Notes (Signed)
  Echocardiogram 2D Echocardiogram has been performed.  Jasmin Hughes T Jasmin Hughes 11/02/2016, 2:17 PM

## 2016-11-03 ENCOUNTER — Ambulatory Visit (HOSPITAL_COMMUNITY)
Admit: 2016-11-03 | Discharge: 2016-11-03 | Disposition: A | Discharge status: Home / Self Care | Payer: Medicare HMO | Attending: Cardiovascular Disease | Admitting: Cardiovascular Disease

## 2016-11-03 ENCOUNTER — Other Ambulatory Visit: Payer: Self-pay | Admitting: Cardiology

## 2016-11-03 DIAGNOSIS — I4891 Unspecified atrial fibrillation: Secondary | ICD-10-CM

## 2016-11-03 DIAGNOSIS — J9621 Acute and chronic respiratory failure with hypoxia: Secondary | ICD-10-CM

## 2016-11-03 DIAGNOSIS — R0602 Shortness of breath: Secondary | ICD-10-CM

## 2016-11-03 DIAGNOSIS — I480 Paroxysmal atrial fibrillation: Secondary | ICD-10-CM

## 2016-11-03 DIAGNOSIS — J441 Chronic obstructive pulmonary disease with (acute) exacerbation: Principal | ICD-10-CM

## 2016-11-03 HISTORY — DX: Acute and chronic respiratory failure with hypoxia: J96.21

## 2016-11-03 LAB — CBC
HEMATOCRIT: 34.6 % — AB (ref 36.0–46.0)
Hemoglobin: 11.5 g/dL — ABNORMAL LOW (ref 12.0–15.0)
MCH: 30.2 pg (ref 26.0–34.0)
MCHC: 33.2 g/dL (ref 30.0–36.0)
MCV: 90.8 fL (ref 78.0–100.0)
Platelets: 316 10*3/uL (ref 150–400)
RBC: 3.81 MIL/uL — ABNORMAL LOW (ref 3.87–5.11)
RDW: 13.1 % (ref 11.5–15.5)
WBC: 14.9 10*3/uL — ABNORMAL HIGH (ref 4.0–10.5)

## 2016-11-03 LAB — HEPARIN LEVEL (UNFRACTIONATED)
HEPARIN UNFRACTIONATED: 0.78 [IU]/mL — AB (ref 0.30–0.70)
Heparin Unfractionated: 0.67 IU/mL (ref 0.30–0.70)

## 2016-11-03 LAB — LIPID PANEL
Cholesterol: 195 mg/dL (ref 0–200)
HDL: 74 mg/dL (ref >40)
LDL CALC: 113 mg/dL — AB (ref 0–99)
TRIGLYCERIDES: 42 mg/dL (ref <150)
Total CHOL/HDL Ratio: 2.6 RATIO
VLDL: 8 mg/dL (ref 0–40)

## 2016-11-03 LAB — BASIC METABOLIC PANEL
Anion gap: 7 (ref 5–15)
BUN: 18 mg/dL (ref 6–20)
CO2: 26 mmol/L (ref 22–32)
CREATININE: 0.77 mg/dL (ref 0.44–1.00)
Calcium: 8.9 mg/dL (ref 8.9–10.3)
Chloride: 104 mmol/L (ref 101–111)
GFR calc Af Amer: >60 mL/min (ref >60)
GLUCOSE: 142 mg/dL — AB (ref 65–99)
POTASSIUM: 4.5 mmol/L (ref 3.5–5.1)
Sodium: 137 mmol/L (ref 135–145)

## 2016-11-03 LAB — TROPONIN I: TROPONIN I: 0.05 ng/mL — AB (ref <0.03)

## 2016-11-03 MED ORDER — REGADENOSON 0.4 MG/5ML IV SOLN: 0.4000 mg | Freq: Once | INTRAVENOUS | Status: DC

## 2016-11-03 MED ORDER — METHYLPREDNISOLONE SODIUM SUCC 125 MG IJ SOLR
60.0000 mg | Freq: Two times a day (BID) | INTRAMUSCULAR | Status: DC
  Administered 2016-11-03 – 2016-11-05 (×4): 60 mg via INTRAVENOUS
  Filled 2016-11-03 (×3): qty 2

## 2016-11-03 MED ORDER — DOXYCYCLINE HYCLATE 100 MG IV SOLR
100.0000 mg | Freq: Two times a day (BID) | INTRAVENOUS | Status: DC
  Administered 2016-11-03 (×2): 100 mg via INTRAVENOUS
  Filled 2016-11-03 (×3): qty 100

## 2016-11-03 MED ORDER — APIXABAN 5 MG PO TABS
5.0000 mg | ORAL_TABLET | Freq: Two times a day (BID) | ORAL | Status: DC
  Administered 2016-11-03 – 2016-11-05 (×5): 5 mg via ORAL
  Filled 2016-11-03 (×5): qty 1

## 2016-11-03 MED ORDER — PNEUMOCOCCAL VAC POLYVALENT 25 MCG/0.5ML IJ INJ
0.5000 mL | INJECTION | INTRAMUSCULAR | Status: DC
  Filled 2016-11-03: qty 0.5

## 2016-11-03 NOTE — Progress Notes (Signed)
ANTICOAGULATION CONSULT NOTE - Follow Up Consult  Pharmacy Consult for Heparin Indication: atrial fibrillation  Allergies  Allergen Reactions  . Other Nausea And Vomiting    CURRY  . Erythromycin Itching    Patient Measurements: Height: 5\' 1"  (154.9 cm) Weight: 141 lb 15.6 oz (64.4 kg) IBW/kg (Calculated) : 47.8 Heparin Dosing Weight:   Vital Signs: Temp: 98.1 F (36.7 C) (10/23 2316) Temp Source: Oral (10/23 2316) BP: 140/68 (10/23 1900) Pulse Rate: 88 (10/23 2316)  Labs:  Recent Labs  11/02/16 0511 11/02/16 1426 11/02/16 1509 11/03/16 0013  HGB 13.4  --   --  11.5*  HCT 40.7  --   --  34.6*  PLT 339  --   --  316  LABPROT  --   --  12.3  --   INR  --   --  0.92  --   HEPARINUNFRC  --   --   --  0.78*  CREATININE 0.70  --   --  0.77  TROPONINI <0.03 0.03*  --  0.05*    Estimated Creatinine Clearance: 55.4 mL/min (by C-G formula based on SCr of 0.77 mg/dL).   Medications:  Infusions:  . sodium chloride    . heparin 950 Units/hr (11/02/16 1530)    Assessment: Patient with high heparin level.  No heparin issues per RN.  Goal of Therapy:  Heparin level 0.3-0.7 units/ml Monitor platelets by anticoagulation protocol: Yes   Plan:  Decrease heparin to 850 units/hr Recheck level at [place], Shea Stakes [place] 11/03/2016,1:21 AM

## 2016-11-03 NOTE — Progress Notes (Signed)
[facility] PROGRESS NOTE    Progress Note  Jasmin Hughes  IDP:[id number] DOB: July 05, 1945 DOA: 11/02/2016 PCP: Maylon Peppers, MD     Brief Narrative:   Jasmin Hughes is an 71 y.o. female past medical history of iron deficiency anemia, history of tobacco abuse presents on the day of admission for 1 day of shortness of breath and hypoxemia accompanied by cough.  Assessment/Plan:   New onset  Atrial fibrillation with RVR ([place]): Now in sinus rhythm after diltiazem was started, cardiology was consulted recommended a Lexiscan Myoview on 11/04/2016. I agree with Eluquis, 2-D echo showed an ejection fraction 50%. In the setting of COPD exacerbation   Acute respiratory failure with hypoxia due to acute COPD exacerbation: I agree with IV steroids inhalers, will add IV antibiotics. We'll continue oxygen her saturations remain greater than 88% on room air. She has mild leukocytosis likely due to IV steroids, she has remained afebrile.  Elevated cardiac biomarkers: In the setting A. Fib with RVR likely demand ischemia, currently on IV heparin.  Iron deficiency anemia Will need further follow-up as an outpatient.  DVT prophylaxis: heparin Family Communication:sister Disposition Plan/Barrier to D/C: home in 3 days Code Status:     Code Status Orders        Start     Ordered   11/02/16 1110  Full code  Continuous     11/02/16 1109    Code Status History    Date Active Date Inactive Code Status Order ID Comments User Context   This patient has a current code status but no historical code status.    Advance Directive Documentation     Most Recent Value  Type of Advance Directive  Living will  Pre-existing out of facility DNR order (yellow form or pink MOST form)  -  "MOST" Form in Place?  -        IV Access:    Peripheral IV   Procedures and diagnostic studies:   Ct Angio Chest Pe W And/or Wo Contrast  Result Date: 11/02/2016 CLINICAL DATA:   Acute onset of severe shortness of breath. Elevated D-dimer. Iron deficiency anemia. Initial encounter. EXAM: CT ANGIOGRAPHY CHEST WITH CONTRAST TECHNIQUE: Multidetector CT imaging of the chest was performed using the standard protocol during bolus administration of intravenous contrast. Multiplanar CT image reconstructions and MIPs were obtained to evaluate the vascular anatomy. CONTRAST:  100 mL of Isovue 370 IV contrast COMPARISON:  Chest radiograph performed earlier today at 5:05 a.m. FINDINGS: Cardiovascular:  There is no evidence of pulmonary embolus. The heart remains normal in size. Mild calcification is noted along the aortic arch. The great vessels are unremarkable in appearance. Mediastinum/Nodes: Scattered coronary artery calcifications are seen. A large hiatal hernia is noted. No mediastinal lymphadenopathy is seen. No pericardial effusion is identified. The thyroid is unremarkable in appearance. No axillary lymphadenopathy is appreciated. Lungs/Pleura: Minimal right basilar atelectasis or scarring is noted. No pleural effusion or pneumothorax is seen. No masses are identified. Upper Abdomen: The visualized portions of the liver and spleen are unremarkable. The visualized portions of the gallbladder, pancreas, adrenal glands and kidneys are within normal limits. Musculoskeletal: No acute osseous abnormalities are identified. The visualized musculature is unremarkable in appearance. Review of the MIP images confirms the above findings. IMPRESSION: 1. No evidence of pulmonary embolus. 2. Minimal right basilar atelectasis or scarring. Lungs otherwise clear. 3. Scattered coronary artery calcifications seen. 4. Large hiatal hernia noted. Electronically Signed   By: Francoise Schaumann.D.  On: 11/02/2016 06:53   Dg Chest Port 1 View  Result Date: 11/02/2016 CLINICAL DATA:  71 y/o  F; shortness of breath. EXAM: PORTABLE CHEST 1 VIEW COMPARISON:  None. FINDINGS: Normal cardiac silhouette given projection and  technique. Large hiatal hernia. No focal consolidation, effusion, or pneumothorax. Bones are unremarkable. IMPRESSION: Large hiatal hernia.  No acute pulmonary process identified. Electronically Signed   By: Kristine Garbe M.D.   On: 11/02/2016 05:48     Medical Consultants:    None.  Anti-Infectives:   IV doxycycline  Subjective:    Jasmin Hughes she relates her breathing is better, she continues to cough denies any fevers.  Objective:    Vitals:   11/03/16 0303 11/03/16 0400 11/03/16 0500 11/03/16 0600  BP:  106/60 105/65 123/70  Pulse:  73 76 88  Resp:  16 17 18   Temp: 98.2 F (36.8 C)     TempSrc: Oral     SpO2:  (!) 88% 93% 90%  Weight:      Height:        Intake/Output Summary (Last 24 hours) at 11/03/16 0747 Last data filed at 11/03/16 0600  Gross per 24 hour  Intake           391.29 ml  Output              100 ml  Net           291.29 ml   Filed Weights   11/02/16 0501 11/02/16 1000  Weight: 61.2 kg (135 lb) 64.4 kg (141 lb 15.6 oz)    Exam: General exam: In no acute distress. Respiratory system: ood air movement with wheezing bilaterally. Cardiovascular system: regular rate and rhythm with positive S1-S2 no murmurs rubs gallops. Gastrointestinal system: Abdomen is nondistended, soft and nontender.  Extremities: No pedal edema. Skin: No rashes, lesions or ulcers Psychiatry: Judgement and insight appear normal. Mood & affect appropriate.    Data Reviewed:    Labs: Basic Metabolic Panel:  Recent Labs Lab 11/02/16 0511 11/03/16 0013  NA 138 137  K 3.3* 4.5  CL 102 104  CO2 27 26  GLUCOSE 148* 142*  BUN 14 18  CREATININE 0.70 0.77  CALCIUM 8.5* 8.9   GFR Estimated Creatinine Clearance: 55.4 mL/min (by C-G formula based on SCr of 0.77 mg/dL). Liver Function Tests: No results for input(s): AST, ALT, ALKPHOS, BILITOT, PROT, ALBUMIN in the last 168 hours. No results for input(s): LIPASE, AMYLASE in the last 168 hours. No  results for input(s): AMMONIA in the last 168 hours. Coagulation profile  Recent Labs Lab 11/02/16 1509  INR 0.92    CBC:  Recent Labs Lab 11/02/16 0511 11/03/16 0013  WBC 8.5 14.9*  NEUTROABS 5.5  --   HGB 13.4 11.5*  HCT 40.7 34.6*  MCV 90.8 90.8  PLT 339 316   Cardiac Enzymes:  Recent Labs Lab 11/02/16 0511 11/02/16 1426 11/03/16 0013  TROPONINI <0.03 0.03* 0.05*   BNP (last 3 results) No results for input(s): PROBNP in the last 8760 hours. CBG: No results for input(s): GLUCAP in the last 168 hours. D-Dimer:  Recent Labs  11/02/16 0511  DDIMER 1.58*   Hgb A1c: No results for input(s): HGBA1C in the last 72 hours. Lipid Profile: No results for input(s): CHOL, HDL, LDLCALC, TRIG, CHOLHDL, LDLDIRECT in the last 72 hours. Thyroid function studies:  Recent Labs  11/02/16 1152  TSH 0.564   Anemia work up: No results for input(s): VITAMINB12, FOLATE, FERRITIN,  TIBC, IRON, RETICCTPCT in the last 72 hours. Sepsis Labs:  Recent Labs Lab 11/02/16 0511 11/03/16 0013  WBC 8.5 14.9*   Microbiology Recent Results (from the past 240 hour(s))  MRSA PCR Screening     Status: None   Collection Time: 11/02/16 10:00 AM  Result Value Ref Range Status   MRSA by PCR NEGATIVE NEGATIVE Final    Comment:        The GeneXpert MRSA Assay (FDA approved for NASAL specimens only), is one component of a comprehensive MRSA colonization surveillance program. It is not intended to diagnose MRSA infection nor to guide or monitor treatment for MRSA infections.      Medications:   . diltiazem  120 mg Oral Daily  . famotidine  20 mg Oral Daily  . ipratropium-albuterol  3 mL Nebulization Q6H  . methylPREDNISolone (SOLU-MEDROL) injection  60 mg Intravenous Q6H  . metoprolol tartrate  25 mg Oral BID  . sodium chloride flush  3 mL Intravenous Q12H   Continuous Infusions: . sodium chloride    . heparin 850 Units/hr (11/03/16 0208)       LOS: 1 day   Charlynne Cousins  [facility] Pager [phone]  *Please refer to [email or web address], password [email or web address] to get updated schedule on who will round on this patient, as hospitalists switch teams weekly. If 7PM-7AM, please contact night-coverage at [email or web address], password [email or web address] for any overnight needs.  11/03/2016, 7:47 AM

## 2016-11-03 NOTE — Care Management Note (Signed)
Case Management Note  Patient Details  Name: RAELYNN CORRON MRN: [id number] Date of Birth: 06-26-1945  Subjective/Objective:                  Chest pain and stress [phone]  Action/Plan: Date:  November 03 2016 Chart reviewed for concurrent status and case management needs.  Will continue to follow patient progress.  Discharge Planning: following for needs  Expected discharge date: November 06, 2016  Velva Harman, BSN, [place], [place]   Expected Discharge Date:                  Expected Discharge Plan:  Home/Self Care  In-House Referral:     Discharge planning Services  CM Consult  Post Acute Care Choice:    Choice offered to:     DME Arranged:    DME Agency:     HH Arranged:    HH Agency:     Status of Service:  In process, will continue to follow  If discussed at Long Length of Stay Meetings, dates discussed:    Additional Comments:  Leeroy Cha, RN 11/03/2016, 9:56 AM

## 2016-11-03 NOTE — Progress Notes (Signed)
Progress Note  Patient Name: Jasmin Hughes Date of Encounter: 11/03/2016  Primary Cardiologist: New- Dr. Oval Linsey  Subjective   Pt is mildly anxious. Seen on stretcher getting ready to go for stress test. Denies chest pain. Is mildly short of breath with activity. Lungs with diffuse wheezing.  Inpatient Medications    Scheduled Meds: . diltiazem  120 mg Oral Daily  . famotidine  20 mg Oral Daily  . ipratropium-albuterol  3 mL Nebulization Q6H  . methylPREDNISolone (SOLU-MEDROL) injection  60 mg Intravenous Q6H  . metoprolol tartrate  25 mg Oral BID  . sodium chloride flush  3 mL Intravenous Q12H   Continuous Infusions: . sodium chloride    . heparin 850 Units/hr (11/03/16 0208)   PRN Meds: sodium chloride, acetaminophen **OR** acetaminophen, albuterol, doxepin, HYDROcodone-acetaminophen, metoprolol tartrate, ondansetron **OR** ondansetron (ZOFRAN) IV, sodium chloride flush   Vital Signs    Vitals:   11/03/16 0303 11/03/16 0400 11/03/16 0500 11/03/16 0600  BP:  106/60 105/65 123/70  Pulse:  73 76 88  Resp:  16 17 18   Temp: 98.2 F (36.8 C)     TempSrc: Oral     SpO2:  (!) 88% 93% 90%  Weight:      Height:        Intake/Output Summary (Last 24 hours) at 11/03/16 0746 Last data filed at 11/03/16 0600  Gross per 24 hour  Intake           391.29 ml  Output              100 ml  Net           291.29 ml   Filed Weights   11/02/16 0501 11/02/16 1000  Weight: 135 lb (61.2 kg) 141 lb 15.6 oz (64.4 kg)    Telemetry    NSR 70's-80's - Personally Reviewed  ECG    No new tracings - Personally Reviewed  Physical Exam   GEN: No acute distress.   Neck: No JVD Cardiac: RRR, no murmurs, rubs, or gallops.  Respiratory: Diffuse expiratory wheezing GI: Soft, nontender, non-distended  MS: No edema; No deformity. Neuro:  Nonfocal  Psych: Normal affect   Labs    Chemistry Recent Labs Lab 11/02/16 0511 11/03/16 0013  NA 138 137  K 3.3* 4.5  CL 102 104    CO2 27 26  GLUCOSE 148* 142*  BUN 14 18  CREATININE 0.70 0.77  CALCIUM 8.5* 8.9  GFRNONAA >60 >60  GFRAA >60 >60  ANIONGAP 9 7     Hematology Recent Labs Lab 11/02/16 0511 11/03/16 0013  WBC 8.5 14.9*  RBC 4.48 3.81*  HGB 13.4 11.5*  HCT 40.7 34.6*  MCV 90.8 90.8  MCH 29.9 30.2  MCHC 32.9 33.2  RDW 12.4 13.1  PLT 339 316    Cardiac Enzymes Recent Labs Lab 11/02/16 0511 11/02/16 1426 11/03/16 0013  TROPONINI <0.03 0.03* 0.05*   No results for input(s): TROPIPOC in the last 168 hours.   BNP Recent Labs Lab 11/02/16 0511  BNP 38.5     DDimer  Recent Labs Lab 11/02/16 0511  DDIMER 1.58*     Radiology    Ct Angio Chest Pe W And/or Wo Contrast  Result Date: 11/02/2016 CLINICAL DATA:  Acute onset of severe shortness of breath. Elevated D-dimer. Iron deficiency anemia. Initial encounter. EXAM: CT ANGIOGRAPHY CHEST WITH CONTRAST TECHNIQUE: Multidetector CT imaging of the chest was performed using the standard protocol during bolus administration of intravenous contrast.  Multiplanar CT image reconstructions and MIPs were obtained to evaluate the vascular anatomy. CONTRAST:  100 mL of Isovue 370 IV contrast COMPARISON:  Chest radiograph performed earlier today at 5:05 a.m. FINDINGS: Cardiovascular:  There is no evidence of pulmonary embolus. The heart remains normal in size. Mild calcification is noted along the aortic arch. The great vessels are unremarkable in appearance. Mediastinum/Nodes: Scattered coronary artery calcifications are seen. A large hiatal hernia is noted. No mediastinal lymphadenopathy is seen. No pericardial effusion is identified. The thyroid is unremarkable in appearance. No axillary lymphadenopathy is appreciated. Lungs/Pleura: Minimal right basilar atelectasis or scarring is noted. No pleural effusion or pneumothorax is seen. No masses are identified. Upper Abdomen: The visualized portions of the liver and spleen are unremarkable. The visualized  portions of the gallbladder, pancreas, adrenal glands and kidneys are within normal limits. Musculoskeletal: No acute osseous abnormalities are identified. The visualized musculature is unremarkable in appearance. Review of the MIP images confirms the above findings. IMPRESSION: 1. No evidence of pulmonary embolus. 2. Minimal right basilar atelectasis or scarring. Lungs otherwise clear. 3. Scattered coronary artery calcifications seen. 4. Large hiatal hernia noted. Electronically Signed   By: Garald Balding M.D.   On: 11/02/2016 06:53   Dg Chest Port 1 View  Result Date: 11/02/2016 CLINICAL DATA:  71 y/o  F; shortness of breath. EXAM: PORTABLE CHEST 1 VIEW COMPARISON:  None. FINDINGS: Normal cardiac silhouette given projection and technique. Large hiatal hernia. No focal consolidation, effusion, or pneumothorax. Bones are unremarkable. IMPRESSION: Large hiatal hernia.  No acute pulmonary process identified. Electronically Signed   By: Kristine Garbe M.D.   On: 11/02/2016 05:48    Cardiac Studies   Echocardiogram 11/05/16 Study Conclusions  - Left ventricle: The cavity size was normal. There was mild focal   basal hypertrophy of the septum. Systolic function was normal.   The estimated ejection fraction was in the range of 60% to 65%.   Wall motion was normal; there were no regional wall motion   abnormalities. Left ventricular diastolic function parameters   were normal. - Mitral valve: There was trivial regurgitation. - Right atrium: The atrium was moderately dilated. - Tricuspid valve: There was trivial regurgitation.  Patient Profile     71 y.o. female female with a hx of iron deficiency anemia, smoking, bipolar affective disorder, HLD, GERD and osteoarthritis who is being seen for the evaluation of atrial fibrillation with RVR. Pt presented for cough and shortness of breath. Developed afib after multiple albuterol nebs.   Assessment & Plan    1. Atrial fibrillation with  RVR: New onset, has never had palpitations, but has had some intermittent chest tightness and DOE for several months. Also had a "min-Stroke" about 6 months ago. Converted to sinus rhythm with IV cardizem and BB. Cardizem converted to oral dosing. BP stable. CHA2DS2/VAS Stroke Risk Score 2 (Age, female). Currently on IV heparin until ischemic testing done then plan to convert to [place] for stroke risk reduction related to afib if no ischemia and need for cath. Lexiscan myoview was planned for today, but pt is wheezing and mildly short of breath. Will postpone stress test until tomorrow when hopefully respiratory status will be improved. Echo shows normal systolic function.  2. Acute respiratory failure with hypoxia due to acute COPD exacerbation: Treatment per IM with IV steroids, inhaler and IV antibiotics. Supplemental O2 as needed.   3. Tobacco use: smokes about 5 or fewer cigarettes per day. Pt encouraged on smoking cessation.  For questions or updates, please contact [place] Please consult [email or web address] for contact info under Cardiology/STEMI.      Signed, Daune Perch, NP  11/03/2016, 7:46 AM

## 2016-11-03 NOTE — Progress Notes (Addendum)
[place] for IV heparin to apixaban Indication: atrial fibrillation  Allergies  Allergen Reactions  . Other Nausea And Vomiting    CURRY  . Erythromycin Itching    Patient Measurements: Height: 5\' 1"  (154.9 cm) Weight: 141 lb 15.6 oz (64.4 kg) IBW/kg (Calculated) : 47.8 Heparin Dosing Weight:   Vital Signs: Temp: 98.5 F (36.9 C) (10/24 0800) Temp Source: Oral (10/24 0800) BP: 135/63 (10/24 1000) Pulse Rate: 94 (10/24 1000)  Labs:  Recent Labs  11/02/16 0511 11/02/16 1426 11/02/16 1509 11/03/16 0013 11/03/16 0953  HGB 13.4  --   --  11.5*  --   HCT 40.7  --   --  34.6*  --   PLT 339  --   --  316  --   LABPROT  --   --  12.3  --   --   INR  --   --  0.92  --   --   HEPARINUNFRC  --   --   --  0.78* 0.67  CREATININE 0.70  --   --  0.77  --   TROPONINI <0.03 0.03*  --  0.05*  --     Estimated Creatinine Clearance: 55.4 mL/min (by C-G formula based on SCr of 0.77 mg/dL).    Assessment: Pharmacy is consulted to dose heparin in 71 yo female diagnosed with new onset atrial fibrillation. PMH includes iron deficiency anemia, smoking, bipolar affective disorder, HLD, GERD and osteoarthritis. Pt reports feeling bad for a few weeks and intermittent chest tightness and mild DOE for several months and also had a mini stroke about 6 months ago, she may have been having PAF for some time.   Currently plan is to convert to apixaban 5 mg PO BID upon discharge (once done with work-up for ischemia).  Today's labs, 11/03/16   Initial heparin level SUPRAtherapeutic, rate reduced to 850 units/hr and heparin level = 0.67 (at upper end goal)  Hgb mildly decreased, pltc WNL  No obvious bleeding noted  Addendum: - Orders to change heparin to apixaban - plan to pursue stress test at outpatient  Goal of Therapy:  Heparin level 0.3-0.7 units/ml Monitor platelets by anticoagulation protocol: Yes   Plan:   apixaban 5mg  PO BID starting  now  Stop heparin gtt with first dose of apixaban  Stop heparin labs  Doreene Eland, PharmD, BCPS.   Pager: [phone] 11/03/2016 10:56 AM

## 2016-11-04 ENCOUNTER — Encounter (HOSPITAL_COMMUNITY): Payer: Self-pay

## 2016-11-04 DIAGNOSIS — D508 Other iron deficiency anemias: Secondary | ICD-10-CM

## 2016-11-04 LAB — HEMOGLOBIN A1C
HEMOGLOBIN A1C: 5.4 % (ref 4.8–5.6)
Mean Plasma Glucose: 108 mg/dL

## 2016-11-04 LAB — CBC
HEMATOCRIT: 33.4 % — AB (ref 36.0–46.0)
HEMOGLOBIN: 11.2 g/dL — AB (ref 12.0–15.0)
MCH: 30.9 pg (ref 26.0–34.0)
MCHC: 33.5 g/dL (ref 30.0–36.0)
MCV: 92.3 fL (ref 78.0–100.0)
Platelets: 293 10*3/uL (ref 150–400)
RBC: 3.62 MIL/uL — AB (ref 3.87–5.11)
RDW: 13.3 % (ref 11.5–15.5)
WBC: 12.7 10*3/uL — ABNORMAL HIGH (ref 4.0–10.5)

## 2016-11-04 MED ORDER — VENLAFAXINE HCL 37.5 MG PO TABS
37.5000 mg | ORAL_TABLET | Freq: Every evening | ORAL | Status: DC
  Administered 2016-11-04: 37.5 mg via ORAL
  Filled 2016-11-04: qty 1

## 2016-11-04 MED ORDER — DOXYCYCLINE HYCLATE 100 MG PO TABS
100.0000 mg | ORAL_TABLET | Freq: Two times a day (BID) | ORAL | Status: DC
  Administered 2016-11-04 – 2016-11-05 (×3): 100 mg via ORAL
  Filled 2016-11-04 (×3): qty 1

## 2016-11-04 NOTE — Progress Notes (Signed)
PHARMACIST - PHYSICIAN COMMUNICATION DR:   Aileen Fass CONCERNING: Antibiotic IV to Oral Route Change Policy  RECOMMENDATION: This patient is receiving doxycycline by the intravenous route.  Based on criteria approved by the Pharmacy and Therapeutics Committee, the antibiotic(s) is/are being converted to the equivalent oral dose form(s).   DESCRIPTION: These criteria include:  Patient being treated for a respiratory tract infection, urinary tract infection, cellulitis or clostridium difficile associated diarrhea if on metronidazole  The patient is not neutropenic and does not exhibit a GI malabsorption state  The patient is eating (either orally or via tube) and/or has been taking other orally administered medications for a least 24 hours  The patient is improving clinically and has a Tmax < 100.5  If you have questions about this conversion, please contact the Pharmacy Department  []   [phone] )  [facility] []   [phone] )  [facility] []   [phone] )  [facility] []   [phone] )  [facility] [x]   [phone] )  [place], [place].D. [phone] 11/04/2016 11:01 AM

## 2016-11-04 NOTE — Progress Notes (Signed)
[facility] PROGRESS NOTE    Progress Note  Jasmin Hughes  HYQ:[id number] DOB: 11/21/1945 DOA: 11/02/2016 PCP: Maylon Peppers, MD     Brief Narrative:   Jasmin Hughes is an 71 y.o. female past medical history of iron deficiency anemia, history of tobacco abuse presents on the day of admission for 1 day of shortness of breath and hypoxemia accompanied by cough.  Assessment/Plan:   New onset  Atrial fibrillation with RVR [facility]): Cardiology deferred Lexi scan Myoview as an outpatient. I agree with Eluquis, 2-D echo showed an ejection fraction 50%. Now in sinus rhythm.  Acute respiratory failure with hypoxia due to acute COPD exacerbation: Patient relates that she usually gets these during the spring or fall. Continue IV steroids, inhalers and antibiotics. Changes have remained stable, her leukocytosis is improving. She has mild leukocytosis likely due to IV steroids, she has remained afebrile.  Elevated cardiac biomarkers: Likely demand ischemia now Eluquis orally.  Iron deficiency anemia Will need further follow-up as an outpatient.  DVT prophylaxis: heparin Family Communication:sister Disposition Plan/Barrier to D/C: Transfer to telemetry. Code Status:     Code Status Orders        Start     Ordered   11/02/16 1110  Full code  Continuous     11/02/16 1109    Code Status History    Date Active Date Inactive Code Status Order ID Comments User Context   This patient has a current code status but no historical code status.    Advance Directive Documentation     Most Recent Value  Type of Advance Directive  Living will  Pre-existing out of facility DNR order (yellow form or pink MOST form)  -  "MOST" Form in Place?  -        IV Access:    Peripheral IV   Procedures and diagnostic studies:   No results found.   Medical Consultants:    None.  Anti-Infectives:   IV doxycycline  Subjective:    Jasmin Hughes tracer  breathing continues to improve, she continues to cough, she would like to take a bath.  Objective:    Vitals:   11/04/16 0300 11/04/16 0400 11/04/16 0500 11/04/16 0600  BP: 138/75 127/64 107/65 130/81  Pulse: 80 80 81 84  Resp: 14 18 15  (!) 24  Temp:  98.3 F (36.8 C)    TempSrc:  Oral    SpO2: 96% 93% 97% 92%  Weight:      Height:        Intake/Output Summary (Last 24 hours) at 11/04/16 0729 Last data filed at 11/03/16 0800  Gross per 24 hour  Intake               17 ml  Output                0 ml  Net               17 ml   Filed Weights   11/02/16 0501 11/02/16 1000  Weight: 61.2 kg (135 lb) 64.4 kg (141 lb 15.6 oz)    Exam: General exam: In no acute distress. Respiratory system: good air movement with wheezing bilaterally. Cardiovascular system: regular rate and rhythm with positive S1-S2 no murmurs rubs gallops. Gastrointestinal system: Abdomen is nondistended, soft and nontender.  Extremities: No pedal edema. Skin: No rashes, lesions or ulcers Psychiatry: Judgement and insight appear normal. Mood & affect appropriate.    Data Reviewed:  Labs: Basic Metabolic Panel:  Recent Labs Lab 11/02/16 0511 11/03/16 0013  NA 138 137  K 3.3* 4.5  CL 102 104  CO2 27 26  GLUCOSE 148* 142*  BUN 14 18  CREATININE 0.70 0.77  CALCIUM 8.5* 8.9   GFR Estimated Creatinine Clearance: 55.4 mL/min (by C-G formula based on SCr of 0.77 mg/dL). Liver Function Tests: No results for input(s): AST, ALT, ALKPHOS, BILITOT, PROT, ALBUMIN in the last 168 hours. No results for input(s): LIPASE, AMYLASE in the last 168 hours. No results for input(s): AMMONIA in the last 168 hours. Coagulation profile  Recent Labs Lab 11/02/16 1509  INR 0.92    CBC:  Recent Labs Lab 11/02/16 0511 11/03/16 0013 11/04/16 0338  WBC 8.5 14.9* 12.7*  NEUTROABS 5.5  --   --   HGB 13.4 11.5* 11.2*  HCT 40.7 34.6* 33.4*  MCV 90.8 90.8 92.3  PLT 339 316 293   Cardiac Enzymes:  Recent  Labs Lab 11/02/16 0511 11/02/16 1426 11/03/16 0013  TROPONINI <0.03 0.03* 0.05*   BNP (last 3 results) No results for input(s): PROBNP in the last 8760 hours. CBG: No results for input(s): GLUCAP in the last 168 hours. D-Dimer:  Recent Labs  11/02/16 0511  DDIMER 1.58*   Hgb A1c:  Recent Labs  11/03/16 0013  HGBA1C 5.4   Lipid Profile:  Recent Labs  11/03/16 0013  CHOL 195  HDL 74  LDLCALC 113*  TRIG 42  CHOLHDL 2.6   Thyroid function studies:  Recent Labs  11/02/16 1152  TSH 0.564   Anemia work up: No results for input(s): VITAMINB12, FOLATE, FERRITIN, TIBC, IRON, RETICCTPCT in the last 72 hours. Sepsis Labs:  Recent Labs Lab 11/02/16 0511 11/03/16 0013 11/04/16 0338  WBC 8.5 14.9* 12.7*   Microbiology Recent Results (from the past 240 hour(s))  MRSA PCR Screening     Status: None   Collection Time: 11/02/16 10:00 AM  Result Value Ref Range Status   MRSA by PCR NEGATIVE NEGATIVE Final    Comment:        The GeneXpert MRSA Assay (FDA approved for NASAL specimens only), is one component of a comprehensive MRSA colonization surveillance program. It is not intended to diagnose MRSA infection nor to guide or monitor treatment for MRSA infections.      Medications:   . apixaban  5 mg Oral BID  . diltiazem  120 mg Oral Daily  . famotidine  20 mg Oral Daily  . ipratropium-albuterol  3 mL Nebulization Q6H  . methylPREDNISolone (SOLU-MEDROL) injection  60 mg Intravenous Q12H  . metoprolol tartrate  25 mg Oral BID  . pneumococcal 23 valent vaccine  0.5 mL Intramuscular Tomorrow-1000  . sodium chloride flush  3 mL Intravenous Q12H   Continuous Infusions: . sodium chloride    . doxycycline (VIBRAMYCIN) IV Stopped (11/03/16 2332)       LOS: 2 days   Charlynne Cousins  [facility] Pager [phone]  *Please refer to [email or web address], password [email or web address] to get updated schedule on who will round on this patient, as hospitalists switch teams  weekly. If 7PM-7AM, please contact night-coverage at [email or web address], password [email or web address] for any overnight needs.  11/04/2016, 7:29 AM

## 2016-11-04 NOTE — Progress Notes (Signed)
Pts. oxygen weaned down from 3 lpm to 2 lpm n/c t/o shift, pt. wanted to try off on room air after being on 1 lpm-saturations 90-92%, RT replaced n/c to 1 lpm, RT to monitor.

## 2016-11-04 NOTE — Progress Notes (Signed)
Pt arrived on O2 2lnc. SPO2 97%. Removed O2. 95% RA. Pt ambulated 169ft on RA. Spo2>95%. MD ok to DC strict bedreast order

## 2016-11-05 MED ORDER — IPRATROPIUM-ALBUTEROL 0.5-2.5 (3) MG/3ML IN SOLN
3.0000 mL | Freq: Three times a day (TID) | RESPIRATORY_TRACT | Status: DC
  Administered 2016-11-05: 3 mL via RESPIRATORY_TRACT
  Filled 2016-11-05: qty 3

## 2016-11-05 MED ORDER — METOPROLOL TARTRATE 25 MG PO TABS: 25.0000 mg | ORAL_TABLET | Freq: Two times a day (BID) | ORAL | 0 refills | Status: DC

## 2016-11-05 MED ORDER — DOXYCYCLINE HYCLATE 100 MG PO TABS: 100.0000 mg | ORAL_TABLET | Freq: Two times a day (BID) | ORAL | 0 refills | Status: DC

## 2016-11-05 MED ORDER — APIXABAN 5 MG PO TABS: 5.0000 mg | ORAL_TABLET | Freq: Two times a day (BID) | ORAL | 0 refills | Status: DC

## 2016-11-05 MED ORDER — PREDNISONE 10 MG PO TABS: ORAL_TABLET | ORAL | 0 refills | Status: DC

## 2016-11-05 MED ORDER — DILTIAZEM HCL ER COATED BEADS 120 MG PO CP24: 120.0000 mg | ORAL_CAPSULE | Freq: Every day | ORAL | 0 refills | Status: DC

## 2016-11-05 NOTE — Discharge Summary (Signed)
Physician Discharge Summary  Jasmin Hughes UVO:[id number] DOB: 1945-04-17 DOA: 11/02/2016  PCP: Maylon Peppers, MD  Admit date: 11/02/2016 Discharge date: 11/05/2016  Admitted From: home Disposition:  Home  Recommendations for Outpatient Follow-up:  1. Follow up with PCP in 1-2 weeks.check a CBC in 1-2 weeks. 2. Refer for pulmonary function test. 3. We'll need a stress as an outpatient.  Home Health:No Equipment/Devices:none  Discharge Condition:stable CODE STATUS:Full Diet recommendation: Heart Healthy   Brief/Interim Summary:  71 y.o. female, with past medical history significant for iron deficiency anemia and history of smoking presenting today with one-day history of shortness of breath and hypoxemia. The patient went to bed coughing last night and woke up in the middle of the night short of breath. No history of fever or chills. No history of nausea or vomiting.. The patient just came back from a cruise in the [place]. No history of PE or DVT. Chest CT in the emergency room was negative for PE. Chest x-ray negative for acute process . While in the emergency room the patient developed after fibrillation with rapid ventricular rate. Patient had some chest tightness and was started on Cardizem drip and the pressor IV when necessary.  Discharge Diagnoses:  Active Problems:   Iron deficiency anemia   COPD exacerbation (HCC)   Atrial fibrillation with RVR (HCC)   Acute on chronic respiratory failure with hypoxia (HCC)  New onset  Atrial fibrillation with RVR ([place]): Dr. On IV diltiazem, 2-D echo showed an EF of 50% cardiology was consulted who recommended a stress as an outpatient. She returned to sinus rhythm she was discharged home on metoprolol diltiazem and Eluquis.  Acute respiratory failure with hypoxia due to acute COPD exacerbation: Wheezing on physical exam and desaturating with ambulation, she was started on IV steroids inhalers and antibiotics. Commissures  ambulated and his saturations remain greater than 90% on room air. She will be discharged home on oral steroids antibiotics, she will need PFT's as an outpatient.  Elevated cardiac biomarkers: She denied any chest pain. Likely demand ischemia now Eluquis orally.  Iron deficiency anemia Will need further follow-up as an outpatient.  Discharge Instructions  Discharge Instructions    Diet - low sodium heart healthy    Complete by:  As directed    Increase activity slowly    Complete by:  As directed      Allergies as of 11/05/2016      Reactions   Other Nausea And Vomiting   CURRY   Erythromycin Itching      Medication List    STOP taking these medications   naproxen sodium 220 MG tablet Commonly known as:  ANAPROX     TAKE these medications   apixaban 5 MG Tabs tablet Commonly known as:  ELIQUIS Take 1 tablet (5 mg total) by mouth 2 (two) times daily.   diltiazem 120 MG 24 hr capsule Commonly known as:  CARDIZEM CD Take 1 capsule (120 mg total) by mouth daily.   doxepin 25 MG capsule Commonly known as:  SINEQUAN Take 25 mg by mouth at bedtime as needed (sleep).   doxycycline 100 MG tablet Commonly known as:  VIBRA-TABS Take 1 tablet (100 mg total) by mouth every 12 (twelve) hours.   ibuprofen 800 MG tablet Commonly known as:  ADVIL,MOTRIN Take 800 mg by mouth every 8 (eight) hours as needed for mild pain.   metoprolol tartrate 25 MG tablet Commonly known as:  LOPRESSOR Take 1 tablet (25 mg total) by  mouth 2 (two) times daily.   predniSONE 10 MG tablet Commonly known as:  DELTASONE Takes 6 tablets for 1 days, then 5 tablets for 1 days, then 4 tablets for 1 days, then 3 tablets for 1 days, then 2 tabs for 1 days, then 1 tab for 1 days, and then stop.   ranitidine 300 MG tablet Commonly known as:  ZANTAC Take 300 mg by mouth every evening.   venlafaxine 37.5 MG tablet Commonly known as:  EFFEXOR Take 37.5 mg by mouth every evening.       Allergies   Allergen Reactions  . Other Nausea And Vomiting    CURRY  . Erythromycin Itching    Consultations:  [facility] cardiology   Procedures/Studies: Ct Angio Chest Pe W And/or Wo Contrast  Result Date: 11/02/2016 CLINICAL DATA:  Acute onset of severe shortness of breath. Elevated D-dimer. Iron deficiency anemia. Initial encounter. EXAM: CT ANGIOGRAPHY CHEST WITH CONTRAST TECHNIQUE: Multidetector CT imaging of the chest was performed using the standard protocol during bolus administration of intravenous contrast. Multiplanar CT image reconstructions and MIPs were obtained to evaluate the vascular anatomy. CONTRAST:  100 mL of Isovue 370 IV contrast COMPARISON:  Chest radiograph performed earlier today at 5:05 a.m. FINDINGS: Cardiovascular:  There is no evidence of pulmonary embolus. The heart remains normal in size. Mild calcification is noted along the aortic arch. The great vessels are unremarkable in appearance. Mediastinum/Nodes: Scattered coronary artery calcifications are seen. A large hiatal hernia is noted. No mediastinal lymphadenopathy is seen. No pericardial effusion is identified. The thyroid is unremarkable in appearance. No axillary lymphadenopathy is appreciated. Lungs/Pleura: Minimal right basilar atelectasis or scarring is noted. No pleural effusion or pneumothorax is seen. No masses are identified. Upper Abdomen: The visualized portions of the liver and spleen are unremarkable. The visualized portions of the gallbladder, pancreas, adrenal glands and kidneys are within normal limits. Musculoskeletal: No acute osseous abnormalities are identified. The visualized musculature is unremarkable in appearance. Review of the MIP images confirms the above findings. IMPRESSION: 1. No evidence of pulmonary embolus. 2. Minimal right basilar atelectasis or scarring. Lungs otherwise clear. 3. Scattered coronary artery calcifications seen. 4. Large hiatal hernia noted. Electronically Signed   By:  Garald Balding M.D.   On: 11/02/2016 06:53   Dg Chest Port 1 View  Result Date: 11/02/2016 CLINICAL DATA:  71 y/o  F; shortness of breath. EXAM: PORTABLE CHEST 1 VIEW COMPARISON:  None. FINDINGS: Normal cardiac silhouette given projection and technique. Large hiatal hernia. No focal consolidation, effusion, or pneumothorax. Bones are unremarkable. IMPRESSION: Large hiatal hernia.  No acute pulmonary process identified. Electronically Signed   By: Kristine Garbe M.D.   On: 11/02/2016 05:48    2-D echo showed no motion abnormalities and an ejection fraction of 50%.   Subjective: She relates her breathing is much improved compared to yesterday, she is able to ambule the hall without getting short of breath.  Discharge Exam: Vitals:   11/04/16 2022 11/05/16 0431  BP:  (!) 147/85  Pulse:  74  Resp:  18  Temp:  98 F (36.7 C)  SpO2: 95% 94%   Vitals:   11/04/16 1452 11/04/16 2001 11/04/16 2022 11/05/16 0431  BP: 139/79 (!) 142/78  (!) 147/85  Pulse: 88 84  74  Resp: 18 20  18   Temp: 98.3 F (36.8 C) 98.2 F (36.8 C)  98 F (36.7 C)  TempSrc: Oral Oral  Oral  SpO2: 97% 95% 95% 94%  Weight:  Height:        General: Pt is alert, awake, not in acute distress Cardiovascular: RRR, S1/S2 +, no rubs, no gallops Respiratory: good air movement clear to auscultation. Abdominal: Soft, NT, ND, bowel sounds + Extremities: no edema, no cyanosis    The results of significant diagnostics from this hospitalization (including imaging, microbiology, ancillary and laboratory) are listed below for reference.     Microbiology: Recent Results (from the past 240 hour(s))  MRSA PCR Screening     Status: None   Collection Time: 11/02/16 10:00 AM  Result Value Ref Range Status   MRSA by PCR NEGATIVE NEGATIVE Final    Comment:        The GeneXpert MRSA Assay (FDA approved for NASAL specimens only), is one component of a comprehensive MRSA colonization surveillance program. It  is not intended to diagnose MRSA infection nor to guide or monitor treatment for MRSA infections.      Labs: BNP (last 3 results)  Recent Labs  11/02/16 0511  BNP 19.6   Basic Metabolic Panel:  Recent Labs Lab 11/02/16 0511 11/03/16 0013  NA 138 137  K 3.3* 4.5  CL 102 104  CO2 27 26  GLUCOSE 148* 142*  BUN 14 18  CREATININE 0.70 0.77  CALCIUM 8.5* 8.9   Liver Function Tests: No results for input(s): AST, ALT, ALKPHOS, BILITOT, PROT, ALBUMIN in the last 168 hours. No results for input(s): LIPASE, AMYLASE in the last 168 hours. No results for input(s): AMMONIA in the last 168 hours. CBC:  Recent Labs Lab 11/02/16 0511 11/03/16 0013 11/04/16 0338  WBC 8.5 14.9* 12.7*  NEUTROABS 5.5  --   --   HGB 13.4 11.5* 11.2*  HCT 40.7 34.6* 33.4*  MCV 90.8 90.8 92.3  PLT 339 316 293   Cardiac Enzymes:  Recent Labs Lab 11/02/16 0511 11/02/16 1426 11/03/16 0013  TROPONINI <0.03 0.03* 0.05*   BNP: Invalid input(s): POCBNP CBG: No results for input(s): GLUCAP in the last 168 hours. D-Dimer No results for input(s): DDIMER in the last 72 hours. Hgb A1c  Recent Labs  11/03/16 0013  HGBA1C 5.4   Lipid Profile  Recent Labs  11/03/16 0013  CHOL 195  HDL 74  LDLCALC 113*  TRIG 42  CHOLHDL 2.6   Thyroid function studies  Recent Labs  11/02/16 1152  TSH 0.564   Anemia work up No results for input(s): VITAMINB12, FOLATE, FERRITIN, TIBC, IRON, RETICCTPCT in the last 72 hours. Urinalysis No results found for: COLORURINE, APPEARANCEUR, [place], [place], [place], [place], [place], [place], PROTEINUR, UROBILINOGEN, NITRITE, LEUKOCYTESUR Sepsis Labs Invalid input(s): PROCALCITONIN,  WBC,  LACTICIDVEN Microbiology Recent Results (from the past 240 hour(s))  MRSA PCR Screening     Status: None   Collection Time: 11/02/16 10:00 AM  Result Value Ref Range Status   MRSA by PCR NEGATIVE NEGATIVE Final    Comment:        The GeneXpert MRSA Assay  (FDA approved for NASAL specimens only), is one component of a comprehensive MRSA colonization surveillance program. It is not intended to diagnose MRSA infection nor to guide or monitor treatment for MRSA infections.      Time coordinating discharge: Over 30 minutes  SIGNED:   Charlynne Cousins, MD  [facility] 11/05/2016, 7:58 AM Pager   If 7PM-7AM, please contact night-coverage [email or web address] Password [email or web address]

## 2016-11-05 NOTE — Care Management Important Message (Signed)
Important Message  Patient Details  Name: Jasmin Hughes MRN: [id number] Date of Birth: December 08, 1945   Medicare Important Message Given:  Yes    Kerin Salen 11/05/2016, 11:30 AMImportant Message  Patient Details  Name: Jasmin Hughes MRN: [id number] Date of Birth: Jan 03, 1946   Medicare Important Message Given:  Yes    Kerin Salen 11/05/2016, 11:30 AM

## 2016-11-12 ENCOUNTER — Telehealth: Payer: Self-pay | Admitting: Cardiovascular Disease

## 2016-11-12 NOTE — Telephone Encounter (Signed)
Closed Encounter  °

## 2016-11-23 DIAGNOSIS — D508 Other iron deficiency anemias: Secondary | ICD-10-CM | POA: Diagnosis not present

## 2016-11-23 DIAGNOSIS — J9621 Acute and chronic respiratory failure with hypoxia: Secondary | ICD-10-CM | POA: Diagnosis not present

## 2016-11-23 DIAGNOSIS — I4891 Unspecified atrial fibrillation: Secondary | ICD-10-CM | POA: Diagnosis not present

## 2016-11-23 DIAGNOSIS — J441 Chronic obstructive pulmonary disease with (acute) exacerbation: Secondary | ICD-10-CM | POA: Diagnosis not present

## 2016-12-10 ENCOUNTER — Encounter: Payer: Self-pay | Admitting: Cardiovascular Disease

## 2016-12-10 ENCOUNTER — Ambulatory Visit: Payer: Medicare HMO | Admitting: Cardiovascular Disease

## 2016-12-10 VITALS — BP 132/72 | HR 65 | Ht 61.0 in | Wt 140.0 lb

## 2016-12-10 DIAGNOSIS — I4891 Unspecified atrial fibrillation: Secondary | ICD-10-CM | POA: Diagnosis not present

## 2016-12-10 DIAGNOSIS — I10 Essential (primary) hypertension: Secondary | ICD-10-CM

## 2016-12-10 NOTE — Patient Instructions (Signed)
Medication Instructions:  Your physician recommends that you continue on your current medications as directed. Please refer to the Current Medication list given to you today.  Labwork: NONE  Testing/Procedures: Your physician has recommended that you wear an event monitor. Event monitors are medical devices that record the heart's electrical activity. Doctors most often [place] these monitors to diagnose arrhythmias. Arrhythmias are problems with the speed or rhythm of the heartbeat. The monitor is a small, portable device. You can wear one while you do your normal daily activities. This is usually used to diagnose what is causing palpitations/syncope (passing out). END OF January 2019 [facility] [address]  Follow-Up: Your physician recommends that you schedule a follow-up appointment in: 1 YEAR   If you need a refill on your cardiac medications before your next appointment, please call your pharmacy.

## 2016-12-10 NOTE — Progress Notes (Signed)
Cardiology Office Note   Date:  12/10/2016   ID:  Jasmin Hughes, DOB April 23, 1945, MRN [id number]  PCP:  Jasmin Peppers, MD  Cardiologist:   Jasmin Latch, MD   No chief complaint on file.    History of Present Illness: Jasmin Hughes is a 71 y.o. female with paroxysmal atrial fibrillation, hypertension, GERD, tobacco abuse, bipolar disorder and COPD who presents for follow-up.  She was admitted 10/2016 with a COPD exacerbation.  She was also noted to be in atrial fibrillation with rapid ventricular response.  She was converted spontaneously to sinus rhythm while on IV diltiazem and beta-blocker.  Troponin was mildly elevated to 0.05, which was felt to be due to demand ischemia.  There were plans for inpatient stress testing but she had persistent wheezing so this was deferred to the outpatient setting.  The echo that admission revealed LVEF 60-65% with normal diastolic function.  She was started on Eliquis for anticogulation.  Since being discharged from the hospital [name] has been doing well.  Her breathing is back to baseline and she denies any chest pain or pressure.  She has not experienced any palpitations, lightheadedness, or dizziness.  Immediately after leaving the hospital she felt very tired and noted that she was sleeping a lot, but this has improved.  She has not noted any lower extremity edema, orthopnea, or PND.  She is back to her usual routine and wonders if she needs to continue taking Eliquis.    Past Medical History:  Diagnosis Date  . Iron deficiency anemia 03/04/2016  . 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 No pertinent surgical history.   Current Outpatient Medications  Medication Sig Dispense Refill  . apixaban (ELIQUIS) 5 MG TABS tablet Take 1 tablet (5 mg total) by mouth 2 (two) times daily. 60 tablet 0  . diltiazem (CARDIZEM CD) 120 MG 24 hr capsule Take 1 capsule (120 mg total) by mouth daily. 30 capsule 0  . doxepin  (SINEQUAN) 25 MG capsule Take 25 mg by mouth at bedtime as needed (sleep).     Marland Kitchen ibuprofen (ADVIL,MOTRIN) 800 MG tablet Take 800 mg by mouth every 8 (eight) hours as needed for mild pain.     . metoprolol tartrate (LOPRESSOR) 25 MG tablet Take 1 tablet (25 mg total) by mouth 2 (two) times daily. 60 tablet 0  . venlafaxine (EFFEXOR) 37.5 MG tablet Take 37.5 mg by mouth every evening.     . ranitidine (ZANTAC) 300 MG tablet Take 300 mg by mouth every evening.      No current facility-administered medications for this visit.     Allergies:   Other and Erythromycin    Social History:  The patient  reports that she quit smoking about 5 weeks ago. Her smoking use included cigarettes. She smoked 0.25 packs per day. she has never used smokeless tobacco. She reports that she does not drink alcohol or use drugs.   Family History:  The patient's family history includes CAD in her father; Heart murmur in her sister; Hyperlipidemia in her brother, sister, and sister; Hypertension in her mother, sister, sister, and son; Lupus in her sister; Mitral valve prolapse in her brother; Stomach cancer in her mother.    ROS:  Please see the history of present illness.   Otherwise, review of systems are positive for none.   All other systems are reviewed and negative.    PHYSICAL EXAM: VS:  BP 132/72   Pulse 65  Ht 5\' 1"  (1.549 m)   Wt 140 lb (63.5 kg)   BMI 26.45 kg/m  , BMI Body mass index is 26.45 kg/m. GENERAL:  Well appearing HEENT:  Pupils equal round and reactive, fundi not visualized, oral mucosa unremarkable NECK:  No jugular venous distention, waveform within normal limits, carotid upstroke brisk and symmetric, no bruits, no thyromegaly LUNGS:  Clear to auscultation bilaterally HEART:  RRR.  PMI not displaced or sustained,S1 and S2 within normal limits, no S3, no S4, no clicks, no rubs, no murmurs ABD:  Flat, positive bowel sounds normal in frequency in pitch, no bruits, no rebound, no guarding,  no midline pulsatile mass, no hepatomegaly, no splenomegaly EXT:  2 plus pulses throughout, no edema, no cyanosis no clubbing SKIN:  No rashes no nodules NEURO:  Cranial nerves II through XII grossly intact, motor grossly intact throughout PSYCH:  Cognitively intact, oriented to person place and time   EKG:  EKG is ordered today. The ekg ordered today demonstrates sinus rhythm.  Rate 65 bpm.  Echo 11/02/16: Study Conclusions  - Left ventricle: The cavity size was normal. There was mild focal   basal hypertrophy of the septum. Systolic function was normal.   The estimated ejection fraction was in the range of 60% to 65%.   Wall motion was normal; there were no regional wall motion   abnormalities. Left ventricular diastolic function parameters   were normal. - Mitral valve: There was trivial regurgitation. - Right atrium: The atrium was moderately dilated. - Tricuspid valve: There was trivial regurgitation.    Recent Labs: 11/02/2016: B Natriuretic Peptide 38.5; TSH 0.564 11/03/2016: BUN 18; Creatinine, Ser 0.77; Potassium 4.5; Sodium 137 11/04/2016: Hemoglobin 11.2; Platelets 293    Lipid Panel    Component Value Date/Time   CHOL 195 11/03/2016 0013   TRIG 42 11/03/2016 0013   HDL 74 11/03/2016 0013   CHOLHDL 2.6 11/03/2016 0013   VLDL 8 11/03/2016 0013   LDLCALC 113 (H) 11/03/2016 0013      Wt Readings from Last 3 Encounters:  12/10/16 140 lb (63.5 kg)  11/02/16 141 lb 15.6 oz (64.4 kg)  10/14/16 137 lb (62.1 kg)      ASSESSMENT AND PLAN:  # Paroxysmal atrial fibrillation: No clear recurrent episodes since discharge.  Continue metoprolol.  She wants to stop Eliquis.  Her echo was completely normal.  We will get a 30-day event monitor at the end of January.  If normal we will switch Eliquis to aspirin 81 mg daily.  # Hypertension: Blood pressure was initially elevated better on repeat.  Continue metoprolol and diltiazem.   Current medicines are reviewed at  length with the patient today.  The patient does not have concerns regarding medicines.  The following changes have been made:  no change  Labs/ tests ordered today include:   Orders Placed This Encounter  Procedures  . CARDIAC EVENT MONITOR  . EKG 12-Lead     Disposition:   FU with Riyanshi Wahab C. Oval Linsey, MD, [facility] in 3 months.     This note was written with the assistance of speech recognition software.  Please excuse any transcriptional errors.  Signed, Theodoro Koval C. Oval Linsey, MD, [facility]  12/10/2016 12:48 PM    [facility]

## 2017-01-14 DIAGNOSIS — J9621 Acute and chronic respiratory failure with hypoxia: Secondary | ICD-10-CM | POA: Diagnosis not present

## 2017-01-14 DIAGNOSIS — J441 Chronic obstructive pulmonary disease with (acute) exacerbation: Secondary | ICD-10-CM | POA: Diagnosis not present

## 2017-01-14 DIAGNOSIS — R69 Illness, unspecified: Secondary | ICD-10-CM | POA: Diagnosis not present

## 2017-01-14 DIAGNOSIS — I4891 Unspecified atrial fibrillation: Secondary | ICD-10-CM | POA: Diagnosis not present

## 2017-01-14 DIAGNOSIS — R0902 Hypoxemia: Secondary | ICD-10-CM | POA: Diagnosis not present

## 2017-01-18 DIAGNOSIS — R0902 Hypoxemia: Secondary | ICD-10-CM

## 2017-01-24 DIAGNOSIS — I4891 Unspecified atrial fibrillation: Secondary | ICD-10-CM | POA: Diagnosis not present

## 2017-01-24 DIAGNOSIS — K219 Gastro-esophageal reflux disease without esophagitis: Secondary | ICD-10-CM | POA: Diagnosis not present

## 2017-01-24 DIAGNOSIS — J441 Chronic obstructive pulmonary disease with (acute) exacerbation: Secondary | ICD-10-CM | POA: Diagnosis not present

## 2017-01-24 DIAGNOSIS — D508 Other iron deficiency anemias: Secondary | ICD-10-CM | POA: Diagnosis not present

## 2017-01-24 DIAGNOSIS — M150 Primary generalized (osteo)arthritis: Secondary | ICD-10-CM | POA: Diagnosis not present

## 2017-01-24 DIAGNOSIS — E663 Overweight: Secondary | ICD-10-CM

## 2017-01-24 DIAGNOSIS — M940 Chondrocostal junction syndrome [Tietze]: Secondary | ICD-10-CM | POA: Diagnosis not present

## 2017-01-24 HISTORY — DX: Overweight: E66.3

## 2017-02-11 DIAGNOSIS — J441 Chronic obstructive pulmonary disease with (acute) exacerbation: Secondary | ICD-10-CM | POA: Diagnosis not present

## 2017-02-15 ENCOUNTER — Ambulatory Visit (INDEPENDENT_AMBULATORY_CARE_PROVIDER_SITE_OTHER): Payer: Medicare HMO

## 2017-02-15 DIAGNOSIS — I4891 Unspecified atrial fibrillation: Secondary | ICD-10-CM | POA: Diagnosis not present

## 2017-03-10 ENCOUNTER — Telehealth: Payer: Self-pay | Admitting: Cardiovascular Disease

## 2017-03-10 NOTE — Telephone Encounter (Signed)
Will forward to Dr Daniel so she will be aware ?

## 2017-03-10 NOTE — Telephone Encounter (Signed)
Spoke with pt, she is not able to tolerate wearing the monitor anymore. She has 3 large blisters were the electrodes have been. She is going to mail the monitor back. Aware she will be charged for the full 30 days. Patient voiced understanding, she has not had any symptoms since she left the hospital and she is not interested in keeping the monitor.

## 2017-03-10 NOTE — Telephone Encounter (Signed)
Please call,concerning  The monitor she had put on 02-15-17.

## 2017-03-14 DIAGNOSIS — J9621 Acute and chronic respiratory failure with hypoxia: Secondary | ICD-10-CM | POA: Diagnosis not present

## 2017-03-14 DIAGNOSIS — I4891 Unspecified atrial fibrillation: Secondary | ICD-10-CM | POA: Diagnosis not present

## 2017-03-14 DIAGNOSIS — J441 Chronic obstructive pulmonary disease with (acute) exacerbation: Secondary | ICD-10-CM | POA: Diagnosis not present

## 2017-03-23 ENCOUNTER — Telehealth: Payer: Self-pay | Admitting: Cardiovascular Disease

## 2017-03-23 NOTE — Telephone Encounter (Signed)
New message   Patient calling for monitor results 

## 2017-03-29 NOTE — Telephone Encounter (Signed)
F/U Call:  Patient states that she is going out of town this weekend and would like to know results before she leaves as she will be gone for a week.

## 2017-03-31 ENCOUNTER — Telehealth: Payer: Self-pay | Admitting: Cardiovascular Disease

## 2017-03-31 NOTE — Telephone Encounter (Signed)
Left message to call back   Below from last office visit so will STOP Eluiquis and start ASA 81 mg daily.   # Paroxysmal atrial fibrillation: No clear recurrent episodes since discharge.  Continue metoprolol.  She wants to stop Eliquis.  Her echo was completely normal.  We will get a 30-day event monitor at the end of January.  If normal we will switch Eliquis to aspirin 81 mg daily.

## 2017-03-31 NOTE — Telephone Encounter (Signed)
-----   Message from Skeet Latch, MD sent at 03/30/2017 12:16 PM EDT ----- PVCs were noted.  There is are not dangerous but can cause palpitations.  No atrial fibrillation noted.  Okay to start Eliquis and switch to aspirin 81 mg daily.

## 2017-03-31 NOTE — Telephone Encounter (Signed)
F/U Call: ° °Patient returning call °

## 2017-03-31 NOTE — Telephone Encounter (Signed)
New Message   Patient is calling in reference to the results of her event monitor. She states that she is going out of town and needs to know.

## 2017-03-31 NOTE — Telephone Encounter (Signed)
Left message to call back  

## 2017-04-01 NOTE — Telephone Encounter (Signed)
Follow Up:    Pt calingl,says she needs to talk to you again

## 2017-04-01 NOTE — Telephone Encounter (Signed)
Follow Up:     Please call,she will be leaving in an hour.Marland Kitchen

## 2017-04-01 NOTE — Telephone Encounter (Signed)
Advised patient of monitor results and medication change

## 2017-04-01 NOTE — Telephone Encounter (Signed)
Patient aware of monitor results and med changes

## 2017-04-04 ENCOUNTER — Telehealth: Payer: Self-pay | Admitting: Cardiovascular Disease

## 2017-04-04 NOTE — Telephone Encounter (Signed)
New message   Patient states she is returning a call to nurse. Patient calling to discuss blood pressure medications.

## 2017-04-04 NOTE — Telephone Encounter (Signed)
Pt wanting to know is she can stop taking her Diltiazem and her Metoprolol or is she is on them forever. Pt to take medications as instructed until f/u with Dr. Oval Linsey in November of this year. Pt verbalized understanding, no additional questions at this time.

## 2017-04-12 DIAGNOSIS — H524 Presbyopia: Secondary | ICD-10-CM | POA: Diagnosis not present

## 2017-04-12 DIAGNOSIS — H43393 Other vitreous opacities, bilateral: Secondary | ICD-10-CM | POA: Diagnosis not present

## 2017-04-12 DIAGNOSIS — H52203 Unspecified astigmatism, bilateral: Secondary | ICD-10-CM | POA: Diagnosis not present

## 2017-04-12 DIAGNOSIS — Z961 Presence of intraocular lens: Secondary | ICD-10-CM

## 2017-04-12 HISTORY — DX: Presence of intraocular lens: Z96.1

## 2017-05-02 ENCOUNTER — Inpatient Hospital Stay: Payer: Medicare HMO

## 2017-05-02 ENCOUNTER — Inpatient Hospital Stay: Payer: Medicare HMO | Attending: Family | Admitting: Family

## 2017-06-17 DIAGNOSIS — K5732 Diverticulitis of large intestine without perforation or abscess without bleeding: Secondary | ICD-10-CM | POA: Diagnosis not present

## 2017-06-17 DIAGNOSIS — E782 Mixed hyperlipidemia: Secondary | ICD-10-CM | POA: Diagnosis not present

## 2017-06-17 DIAGNOSIS — I4891 Unspecified atrial fibrillation: Secondary | ICD-10-CM | POA: Diagnosis not present

## 2017-06-17 DIAGNOSIS — J441 Chronic obstructive pulmonary disease with (acute) exacerbation: Secondary | ICD-10-CM | POA: Diagnosis not present

## 2017-06-17 DIAGNOSIS — I679 Cerebrovascular disease, unspecified: Secondary | ICD-10-CM | POA: Diagnosis not present

## 2017-06-17 DIAGNOSIS — I10 Essential (primary) hypertension: Secondary | ICD-10-CM | POA: Diagnosis not present

## 2017-06-17 DIAGNOSIS — M150 Primary generalized (osteo)arthritis: Secondary | ICD-10-CM | POA: Diagnosis not present

## 2017-07-21 DIAGNOSIS — N3001 Acute cystitis with hematuria: Secondary | ICD-10-CM | POA: Diagnosis not present

## 2017-09-28 DIAGNOSIS — J441 Chronic obstructive pulmonary disease with (acute) exacerbation: Secondary | ICD-10-CM | POA: Diagnosis not present

## 2017-09-28 DIAGNOSIS — J9621 Acute and chronic respiratory failure with hypoxia: Secondary | ICD-10-CM | POA: Diagnosis not present

## 2017-12-20 DIAGNOSIS — Z0000 Encounter for general adult medical examination without abnormal findings: Secondary | ICD-10-CM | POA: Diagnosis not present

## 2017-12-20 DIAGNOSIS — Z1239 Encounter for other screening for malignant neoplasm of breast: Secondary | ICD-10-CM | POA: Diagnosis not present

## 2017-12-20 DIAGNOSIS — R69 Illness, unspecified: Secondary | ICD-10-CM | POA: Diagnosis not present

## 2017-12-20 DIAGNOSIS — I480 Paroxysmal atrial fibrillation: Secondary | ICD-10-CM | POA: Diagnosis not present

## 2017-12-20 DIAGNOSIS — J432 Centrilobular emphysema: Secondary | ICD-10-CM | POA: Diagnosis not present

## 2017-12-20 DIAGNOSIS — R748 Abnormal levels of other serum enzymes: Secondary | ICD-10-CM | POA: Diagnosis not present

## 2017-12-22 DIAGNOSIS — Z1231 Encounter for screening mammogram for malignant neoplasm of breast: Secondary | ICD-10-CM | POA: Diagnosis not present

## 2017-12-22 DIAGNOSIS — Z1239 Encounter for other screening for malignant neoplasm of breast: Secondary | ICD-10-CM | POA: Diagnosis not present

## 2017-12-27 DIAGNOSIS — B078 Other viral warts: Secondary | ICD-10-CM | POA: Diagnosis not present

## 2017-12-27 DIAGNOSIS — L821 Other seborrheic keratosis: Secondary | ICD-10-CM | POA: Diagnosis not present

## 2017-12-27 DIAGNOSIS — D485 Neoplasm of uncertain behavior of skin: Secondary | ICD-10-CM | POA: Diagnosis not present

## 2017-12-27 DIAGNOSIS — L720 Epidermal cyst: Secondary | ICD-10-CM | POA: Diagnosis not present

## 2018-04-18 ENCOUNTER — Telehealth: Payer: Self-pay | Admitting: *Deleted

## 2018-04-18 NOTE — Telephone Encounter (Signed)
Patient called from recall list, past due for follow up visit with Dr [place] Called to try to arrange virtual visit. Left message to call back   

## 2018-04-27 ENCOUNTER — Telehealth: Payer: Self-pay | Admitting: Cardiovascular Disease

## 2018-04-27 NOTE — Telephone Encounter (Signed)
Follow up:   Patient returning a call concerning appt. Wasn't clear on when she would like to see the patient.

## 2018-05-01 NOTE — Telephone Encounter (Signed)
Spoke with patient and she does not want to scheduled virtual visit, she scheduled follow up in September

## 2018-09-14 ENCOUNTER — Ambulatory Visit: Payer: Medicare HMO | Admitting: Cardiovascular Disease

## 2018-09-14 ENCOUNTER — Other Ambulatory Visit: Payer: Self-pay

## 2018-09-14 VITALS — BP 145/87 | HR 72 | Ht 61.5 in | Wt 153.0 lb

## 2018-09-14 DIAGNOSIS — I10 Essential (primary) hypertension: Secondary | ICD-10-CM | POA: Diagnosis not present

## 2018-09-14 DIAGNOSIS — R072 Precordial pain: Secondary | ICD-10-CM

## 2018-09-14 DIAGNOSIS — Z01812 Encounter for preprocedural laboratory examination: Secondary | ICD-10-CM

## 2018-09-14 NOTE — Progress Notes (Signed)
Cardiology Office Note   Date:  09/14/2018   ID:  Jasmin Hughes, DOB 1945/07/16, MRN [id number]  PCP:  Maylon Peppers, MD  Cardiologist:   Skeet Latch, MD   No chief complaint on file.    History of Present Illness: Jasmin Hughes is a 73 y.o. female with paroxysmal atrial fibrillation, hypertension, GERD, tobacco abuse, bipolar disorder and COPD who presents for follow-up.  She was admitted 10/2016 with a COPD exacerbation.  She was also noted to be in atrial fibrillation with rapid ventricular response.  She was converted spontaneously to sinus rhythm while on IV diltiazem and beta-blocker.  Troponin was mildly elevated to 0.05, which was felt to be due to demand ischemia.  There were plans for inpatient stress testing but she had persistent wheezing so this was deferred to the outpatient setting.  The echo that admission revealed LVEF 60-65% with normal diastolic function.  She was started on Eliquis for anticogulation.  Jasmin Hughes reports that she feels like she has a band around her chest.  She takes her bra off and her breasts feel very heavy.  It is hard for her to take a deep breath.  It has been ongoing for months and seems to be worse in the evening.  It feels somewhat better after she takes her bra off.  She has also noted severe bilateral jaw pain for the last month.  She denies nausea or diaphoresis.  She struggles with her breathing.  She gets out of breath very easily.  It seems worse some days than others.  By the time she gets to the top of the steps she is out of breath.  She has bad cramping in both legs R>L.  She also gets cramping in her hands.  It is worse in the right leg and worse in bed at night. It occasionally happens when sitting in the car or driving.  Of note she has a hiatal hernia surgery that failed.  She continues to have dysphasia and sometimes chokes on liquids.  She reports having a heart catheterization many years ago that was reportedly  normal.  Last month she had an episode of weakness that felt like her prior episode of atrial fibrillation.  She did not have palpitations but became suddenly weak.  The episode lasted for several minutes and then resolved.  There was no associated chest pain at that time.  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   Current Outpatient Medications  Medication Sig Dispense Refill   aspirin EC 81 MG tablet Take 81 mg by mouth daily.     diltiazem (CARDIZEM CD) 120 MG 24 hr capsule Take 1 capsule (120 mg total) by mouth daily. 30 capsule 0   Ferrous Sulfate (IRON) 325 (65 Fe) MG TABS Take by mouth.     ibuprofen (ADVIL,MOTRIN) 800 MG tablet Take 800 mg by mouth every 8 (eight) hours as needed for mild pain.      magnesium citrate SOLN Take 1 Bottle by mouth once.     metoprolol tartrate (LOPRESSOR) 25 MG tablet Take 1 tablet (25 mg total) by mouth 2 (two) times daily. 60 tablet 0   multivitamin-iron-minerals-folic acid (CENTRUM) chewable tablet Chew 1 tablet by mouth daily.     Nutritional Supplements (VITAMIN D BOOSTER PO) Take by mouth.     venlafaxine (EFFEXOR) 37.5 MG tablet Take  37.5 mg by mouth every evening.      No current facility-administered medications for this visit.     Allergies:   Other and Erythromycin    Social History:  The patient  reports that she quit smoking about 22 months ago. Her smoking use included cigarettes. She smoked 0.25 packs per day. She has never used smokeless tobacco. She reports that she does not drink alcohol or use drugs.   Family History:  The patient's family history includes CAD in her father; Heart murmur in her sister; Hyperlipidemia in her brother, sister, and sister; Hypertension in her mother, sister, sister, and son; Lupus in her sister; Mitral valve prolapse in her brother; Stomach cancer in her mother.    ROS:  Please see the  history of present illness.   Otherwise, review of systems are positive for none.   All other systems are reviewed and negative.    PHYSICAL EXAM: VS:  BP (!) 145/87    Pulse 72    Ht 5' 1.5" (1.562 m)    Wt 153 lb (69.4 kg)    SpO2 93%    BMI 28.44 kg/m  , BMI Body mass index is 28.44 kg/m. GENERAL:  Well appearing HEENT:  Pupils equal round and reactive, fundi not visualized, oral mucosa unremarkable NECK:  No jugular venous distention, waveform within normal limits, carotid upstroke brisk and symmetric, no bruits, no thyromegaly LUNGS:  Clear to auscultation bilaterally HEART:  RRR.  PMI not displaced or sustained,S1 and S2 within normal limits, no S3, no S4, no clicks, no rubs, no murmurs ABD:  Flat, positive bowel sounds normal in frequency in pitch, no bruits, no rebound, no guarding, no midline pulsatile mass, no hepatomegaly, no splenomegaly EXT:  2 plus pulses throughout, no edema, no cyanosis no clubbing SKIN:  No rashes no nodules NEURO:  Cranial nerves II through XII grossly intact, motor grossly intact throughout PSYCH:  Cognitively intact, oriented to person place and time   EKG:  EKG is ordered today. The ekg ordered 12/10/16 demonstrates sinus rhythm.  Rate 65 bpm. 09/14/18: Sinus rhythm.  Rate 72 bpm.    Echo 11/02/16: Study Conclusions  - Left ventricle: The cavity size was normal. There was mild focal   basal hypertrophy of the septum. Systolic function was normal.   The estimated ejection fraction was in the range of 60% to 65%.   Wall motion was normal; there were no regional wall motion   abnormalities. Left ventricular diastolic function parameters   were normal. - Mitral valve: There was trivial regurgitation. - Right atrium: The atrium was moderately dilated. - Tricuspid valve: There was trivial regurgitation.    Recent Labs: No results found for requested labs within last 8760 hours.    Lipid Panel    Component Value Date/Time   CHOL 195  11/03/2016 0013   TRIG 42 11/03/2016 0013   HDL 74 11/03/2016 0013   CHOLHDL 2.6 11/03/2016 0013   VLDL 8 11/03/2016 0013   LDLCALC 113 (H) 11/03/2016 0013      Wt Readings from Last 3 Encounters:  09/14/18 153 lb (69.4 kg)  12/10/16 140 lb (63.5 kg)  11/02/16 141 lb 15.6 oz (64.4 kg)      ASSESSMENT AND PLAN:  # Atypical chest pain:  Jasmin Hughes symptoms are quite atypical.  However she does have risk factors for CAD.  We will get a coronary CT-a to better assess.  Continue aspirin and metoprolol.  # Paroxysmal atrial fibrillation: No  clear recurrent episodes since discharge.  There were no repeat episodes on her 30-day event monitor.  However she has had some episodes of weakness that seems similar to how she felt in the hospital.  For now we will continue diltiazem and metoprolol.  Based on her normal monitor results before she wanted to stop Eliquis and switch to aspirin.  If she continues to have these spells we will have to revisit this decision.  # Hypertension: Blood pressure was initially elevated better on repeat.  Continue metoprolol and diltiazem.   Current medicines are reviewed at length with the patient today.  The patient does not have concerns regarding medicines.  The following changes have been made:  no change  Labs/ tests ordered today include:   No orders of the defined types were placed in this encounter.    Disposition:   FU in 2 months.  She wants to follow-up in our [facility] office as it is closer to her home.   This note was written with the assistance of speech recognition software.  Please excuse any transcriptional errors.  Signed, Oaklyn Mans C. Oval Linsey, MD, [facility]  09/14/2018 2:45 PM    [place]

## 2018-09-14 NOTE — Patient Instructions (Addendum)
Medication Instructions:  Your physician recommends that you continue on your current medications as directed. Please refer to the Current Medication list given to you today.  If you need a refill on your cardiac medications before your next appointment, please call your pharmacy.   Lab work: BMET 1 WEEK PRIOR TO CARDIAC CT  If you have labs (blood work) drawn today and your tests are completely normal, you will receive your results only by: Marland Kitchen MyChart Message (if you have MyChart) OR . A paper copy in the mail If you have any lab test that is abnormal or we need to change your treatment, we will call you to review the results.  Testing/Procedures: Your physician has requested that you have cardiac CT. Cardiac computed tomography (CT) is a painless test that uses an x-ray machine to take clear, detailed pictures of your heart. For further information please visit [employer]. Please follow instruction sheet as given. ONCE INSURANCE HAS APPROVED THE OFFICE WILL CALL YOU TO ARRANGE  Follow-Up: Your physician recommends that you schedule a follow-up appointment in: 2 MONTHS IN THE [place] [facility]  Any Other Special Instructions Will Be Listed Below (If Applicable).            Your cardiac CT will be scheduled at one of the below locations:   [facility] [address] [place], [place] [address] (336) [place] [address] [place], [place] [address] [phone]  Please arrive at the [facility] main entrance of [facility] 30-45 minutes prior to test start time. Proceed to the [facility] Radiology Department (first floor) to check-in and test prep.  Please follow these instructions carefully (unless otherwise directed):  Hold all erectile dysfunction medications at least 48 hours prior to test.  On the Night Before the Test: . Be sure to Drink plenty of water. . Do not  consume any caffeinated/decaffeinated beverages or chocolate 12 hours prior to your test. . Do not take any antihistamines 12 hours prior to your test. . If you take Metformin do not take 24 hours prior to test. . If the patient has contrast allergy: ? Patient will need a prescription for Prednisone and very clear instructions (as follows): 1. Prednisone 50 mg - take 13 hours prior to test 2. Take another Prednisone 50 mg 7 hours prior to test 3. Take another Prednisone 50 mg 1 hour prior to test 4. Take Benadryl 50 mg 1 hour prior to test . Patient must complete all four doses of above prophylactic medications. . Patient will need a ride after test due to Benadryl.  On the Day of the Test: . Drink plenty of water. Do not drink any water within one hour of the test. . Do not eat any food 4 hours prior to the test. . You may take your regular medications prior to the test.  . Take metoprolol (Lopressor) two hours prior to test. . HOLD Furosemide/Hydrochlorothiazide morning of the test. . FEMALES- please wear underwire-free bra if available      After the Test: . Drink plenty of water. . After receiving IV contrast, you may experience a mild flushed feeling. This is normal. . On occasion, you may experience a mild rash up to 24 hours after the test. This is not dangerous. If this occurs, you can take Benadryl 25 mg and increase your fluid intake. . If you experience trouble breathing, this can be serious. If it is  severe call 911 IMMEDIATELY. If it is mild, please call our office. . If you take any of these medications: Glipizide/Metformin, Avandament, Glucavance, please do not take 48 hours after completing test.    Please contact the cardiac imaging nurse navigator should you have any questions/concerns Marchia Bond, RN Navigator Cardiac Imaging [facility] [phone] Office  [phone] Cell    Cardiac CT Angiogram  A cardiac CT angiogram is a  procedure to look at the heart and the area around the heart. It may be done to help find the cause of chest pains or other symptoms of heart disease. During this procedure, a large X-ray machine, called a CT scanner, takes detailed pictures of the heart and the surrounding area after a dye (contrast material) has been injected into blood vessels in the area. The procedure is also sometimes called a coronary CT angiogram, coronary artery scanning, or CTA. A cardiac CT angiogram allows the health care provider to see how well blood is flowing to and from the heart. The health care provider will be able to see if there are any problems, such as:  Blockage or narrowing of the coronary arteries in the heart.  Fluid around the heart.  Signs of weakness or disease in the muscles, valves, and tissues of the heart. Tell a health care provider about:  Any allergies you have. This is especially important if you have had a previous allergic reaction to contrast dye.  All medicines you are taking, including vitamins, herbs, eye drops, creams, and over-the-counter medicines.  Any blood disorders you have.  Any surgeries you have had.  Any medical conditions you have.  Whether you are pregnant or may be pregnant.  Any anxiety disorders, chronic pain, or other conditions you have that may increase your stress or prevent you from lying still. What are the risks? Generally, this is a safe procedure. However, problems may occur, including:  Bleeding.  Infection.  Allergic reactions to medicines or dyes.  Damage to other structures or organs.  Kidney damage from the dye or contrast that is used.  Increased risk of cancer from radiation exposure. This risk is low. Talk with your health care provider about: ? The risks and benefits of testing. ? How you can receive the lowest dose of radiation. What happens before the procedure?  Wear comfortable clothing and remove any jewelry, glasses,  dentures, and hearing aids.  Follow instructions from your health care provider about eating and drinking. This may include: ? For 12 hours before the test - avoid caffeine. This includes tea, coffee, soda, energy drinks, and diet pills. Drink plenty of water or other fluids that do not have caffeine in them. Being well-hydrated can prevent complications. ? For 4-6 hours before the test - stop eating and drinking. The contrast dye can cause nausea, but this is less likely if your stomach is empty.  Ask your health care provider about changing or stopping your regular medicines. This is especially important if you are taking diabetes medicines, blood thinners, or medicines to treat erectile dysfunction. What happens during the procedure?  Hair on your chest may need to be removed so that small sticky patches called electrodes can be placed on your chest. These will transmit information that helps to monitor your heart during the test.  An IV tube will be inserted into one of your veins.  You might be given a medicine to control your heart rate during the test. This will help to  ensure that good images are obtained.  You will be asked to lie on an exam table. This table will slide in and out of the CT machine during the procedure.  Contrast dye will be injected into the IV tube. You might feel warm, or you may get a metallic taste in your mouth.  You will be given a medicine (nitroglycerin) to relax (dilate) the arteries in your heart.  The table that you are lying on will move into the CT machine tunnel for the scan.  The person running the machine will give you instructions while the scans are being done. You may be asked to: ? Keep your arms above your head. ? Hold your breath. ? Stay very still, even if the table is moving.  When the scanning is complete, you will be moved out of the machine.  The IV tube will be removed. The procedure may vary among health care providers and  hospitals. What happens after the procedure?  You might feel warm, or you may get a metallic taste in your mouth from the contrast dye.  You may have a headache from the nitroglycerin.  After the procedure, drink water or other fluids to wash (flush) the contrast material out of your body.  Contact a health care provider if you have any symptoms of allergy to the contrast. These symptoms include: ? Shortness of breath. ? Rash or hives. ? A racing heartbeat.  Most people can return to their normal activities right after the procedure. Ask your health care provider what activities are safe for you.  It is up to you to get the results of your procedure. Ask your health care provider, or the department that is doing the procedure, when your results will be ready. Summary  A cardiac CT angiogram is a procedure to look at the heart and the area around the heart. It may be done to help find the cause of chest pains or other symptoms of heart disease.  During this procedure, a large X-ray machine, called a CT scanner, takes detailed pictures of the heart and the surrounding area after a dye (contrast material) has been injected into blood vessels in the area.  Ask your health care provider about changing or stopping your regular medicines before the procedure. This is especially important if you are taking diabetes medicines, blood thinners, or medicines to treat erectile dysfunction.  After the procedure, drink water or other fluids to wash (flush) the contrast material out of your body. This information is not intended to replace advice given to you by your health care provider. Make sure you discuss any questions you have with your health care provider. Document Released: 12/11/2007 Document Revised: 12/10/2016 Document Reviewed: 11/17/2015 Elsevier Patient Education  2020 [employer].

## 2018-09-22 ENCOUNTER — Encounter: Payer: Self-pay | Admitting: Cardiovascular Disease

## 2018-11-14 ENCOUNTER — Ambulatory Visit: Payer: Medicare HMO | Admitting: Cardiology

## 2018-11-16 ENCOUNTER — Ambulatory Visit: Payer: Medicare HMO | Admitting: Cardiology

## 2018-11-16 ENCOUNTER — Other Ambulatory Visit: Payer: Self-pay

## 2018-11-16 ENCOUNTER — Encounter: Payer: Self-pay | Admitting: Cardiology

## 2018-11-16 DIAGNOSIS — R0609 Other forms of dyspnea: Secondary | ICD-10-CM

## 2018-11-16 DIAGNOSIS — R079 Chest pain, unspecified: Secondary | ICD-10-CM

## 2018-11-16 DIAGNOSIS — R0789 Other chest pain: Secondary | ICD-10-CM

## 2018-11-16 DIAGNOSIS — I480 Paroxysmal atrial fibrillation: Secondary | ICD-10-CM

## 2018-11-16 DIAGNOSIS — R0600 Dyspnea, unspecified: Secondary | ICD-10-CM

## 2018-11-16 HISTORY — DX: Other forms of dyspnea: R06.09

## 2018-11-16 HISTORY — DX: Paroxysmal atrial fibrillation: I48.0

## 2018-11-16 HISTORY — DX: Dyspnea, unspecified: R06.00

## 2018-11-16 HISTORY — DX: Other chest pain: R07.89

## 2018-11-16 NOTE — Patient Instructions (Addendum)
Medication Instructions:  Your physician recommends that you continue on your current medications as directed. Please refer to the Current Medication list given to you today.  *If you need a refill on your cardiac medications before your next appointment, please call your pharmacy*  Lab Work: Your physician recommends that you return for lab work today: cmp, mg   If you have labs (blood work) drawn today and your tests are completely normal, you will receive your results only by: Marland Kitchen MyChart Message (if you have MyChart) OR . A paper copy in the mail If you have any lab test that is abnormal or we need to change your treatment, we will call you to review the results.  Return 3-7 days before ct for labs: BMP  Testing/Procedures: Your physician has requested that you have cardiac CT. Cardiac computed tomography (CT) is a painless test that uses an x-ray machine to take clear, detailed pictures of your heart. For further information please visit [email or web address]. Please follow instruction sheet as given.   Your cardiac CT will be scheduled at one of the below locations:   [facility] [address] [place], [place] [address] (336) [place] [address] [place],  [address] [phone]  If scheduled at [facility], please arrive at the [facility] main entrance of [facility] 30-45 minutes prior to test start time. Proceed to the [facility] Radiology Department (first floor) to check-in and test prep.  If scheduled at [facility], please arrive 15 mins early for check-in and test prep.  Please follow these instructions carefully (unless otherwise directed):   On the Night Before the Test: . Be sure to Drink plenty of water. . Do not consume any caffeinated/decaffeinated beverages or chocolate 12 hours prior to your test. . Do not take any antihistamines  12 hours prior to your test.   On the Day of the Test: . Drink plenty of water. Do not drink any water within one hour of the test. . Do not eat any food 4 hours prior to the test. . You may take your regular medications prior to the test.  . FEMALES- please wear underwire-free bra if available          -Take metoprolol (Lopressor) 2 hours prior to test (if applicable).          After the Test: . Drink plenty of water. . After receiving IV contrast, you may experience a mild flushed feeling. This is normal. . On occasion, you may experience a mild rash up to 24 hours after the test. This is not dangerous. If this occurs, you can take Benadryl 25 mg and increase your fluid intake. . If you experience trouble breathing, this can be serious. If it is severe call 911 IMMEDIATELY. If it is mild, please call our office. . If you take any of these medications: Glipizide/Metformin, Avandament, Glucavance, please do not take 48 hours after completing test unless otherwise instructed.   Once we have confirmed authorization from your insurance company, we will call you to set up a date and time for your test.   For non-scheduling related questions, please contact the cardiac imaging nurse navigator should you have any questions/concerns: Marchia Bond, RN Navigator Cardiac Imaging [facility] [phone] Office   Your physician has recommended that you wear a holter monitor. Holter monitors are medical devices that record the heart's electrical activity.  Doctors most often use these monitors to diagnose arrhythmias. Arrhythmias are problems with the speed or rhythm of the heartbeat. The monitor is a small, portable device. You can wear one while you do your normal daily activities. This is usually used to diagnose what is causing palpitations/syncope (passing out). Wear for 7 days.      Follow-Up: At [facility], you and your health needs are our priority.  As  part of our continuing mission to provide you with exceptional heart care, we have created designated Provider Care Teams.  These Care Teams include your primary Cardiologist (physician) and Advanced Practice Providers (APPs -  Physician Assistants and Nurse Practitioners) who all work together to provide you with the care you need, when you need it.  Your next appointment:   2 months    The format for your next appointment:   In Person  Provider:   You may see Dr. Agustin Cree or the following Advanced Practice Provider on your designated Care Team:    Laurann Montana, FNP   Other Instructions   Cardiac CT Angiogram  A cardiac CT angiogram is a procedure to look at the heart and the area around the heart. It may be done to help find the cause of chest pains or other symptoms of heart disease. During this procedure, a large X-ray machine, called a CT scanner, takes detailed pictures of the heart and the surrounding area after a dye (contrast material) has been injected into blood vessels in the area. The procedure is also sometimes called a coronary CT angiogram, coronary artery scanning, or CTA. A cardiac CT angiogram allows the health care provider to see how well blood is flowing to and from the heart. The health care provider will be able to see if there are any problems, such as:  Blockage or narrowing of the coronary arteries in the heart.  Fluid around the heart.  Signs of weakness or disease in the muscles, valves, and tissues of the heart. Tell a health care provider about:  Any allergies you have. This is especially important if you have had a previous allergic reaction to contrast dye.  All medicines you are taking, including vitamins, herbs, eye drops, creams, and over-the-counter medicines.  Any blood disorders you have.  Any surgeries you have had.  Any medical conditions you have.  Whether you are pregnant or may be pregnant.  Any anxiety disorders, chronic pain,  or other conditions you have that may increase your stress or prevent you from lying still. What are the risks? Generally, this is a safe procedure. However, problems may occur, including:  Bleeding.  Infection.  Allergic reactions to medicines or dyes.  Damage to other structures or organs.  Kidney damage from the dye or contrast that is used.  Increased risk of cancer from radiation exposure. This risk is low. Talk with your health care provider about: ? The risks and benefits of testing. ? How you can receive the lowest dose of radiation. What happens before the procedure?  Wear comfortable clothing and remove any jewelry, glasses, dentures, and hearing aids.  Follow instructions from your health care provider about eating and drinking. This may include: ? For 12 hours before the test - avoid caffeine. This includes tea, coffee, soda, energy drinks, and diet pills. Drink plenty of water or other fluids that do not have caffeine in them. Being well-hydrated can prevent complications. ? For 4-6 hours before the test - stop eating and drinking. The contrast dye can  cause nausea, but this is less likely if your stomach is empty.  Ask your health care provider about changing or stopping your regular medicines. This is especially important if you are taking diabetes medicines, blood thinners, or medicines to treat erectile dysfunction. What happens during the procedure?  Hair on your chest may need to be removed so that small sticky patches called electrodes can be placed on your chest. These will transmit information that helps to monitor your heart during the test.  An IV tube will be inserted into one of your veins.  You might be given a medicine to control your heart rate during the test. This will help to ensure that good images are obtained.  You will be asked to lie on an exam table. This table will slide in and out of the CT machine during the procedure.  Contrast dye will be  injected into the IV tube. You might feel warm, or you may get a metallic taste in your mouth.  You will be given a medicine (nitroglycerin) to relax (dilate) the arteries in your heart.  The table that you are lying on will move into the CT machine tunnel for the scan.  The person running the machine will give you instructions while the scans are being done. You may be asked to: ? Keep your arms above your head. ? Hold your breath. ? Stay very still, even if the table is moving.  When the scanning is complete, you will be moved out of the machine.  The IV tube will be removed. The procedure may vary among health care providers and hospitals. What happens after the procedure?  You might feel warm, or you may get a metallic taste in your mouth from the contrast dye.  You may have a headache from the nitroglycerin.  After the procedure, drink water or other fluids to wash (flush) the contrast material out of your body.  Contact a health care provider if you have any symptoms of allergy to the contrast. These symptoms include: ? Shortness of breath. ? Rash or hives. ? A racing heartbeat.  Most people can return to their normal activities right after the procedure. Ask your health care provider what activities are safe for you.  It is up to you to get the results of your procedure. Ask your health care provider, or the department that is doing the procedure, when your results will be ready. Summary  A cardiac CT angiogram is a procedure to look at the heart and the area around the heart. It may be done to help find the cause of chest pains or other symptoms of heart disease.  During this procedure, a large X-ray machine, called a CT scanner, takes detailed pictures of the heart and the surrounding area after a dye (contrast material) has been injected into blood vessels in the area.  Ask your health care provider about changing or stopping your regular medicines before the procedure.  This is especially important if you are taking diabetes medicines, blood thinners, or medicines to treat erectile dysfunction.  After the procedure, drink water or other fluids to wash (flush) the contrast material out of your body. This information is not intended to replace advice given to you by your health care provider. Make sure you discuss any questions you have with your health care provider. Document Released: 12/11/2007 Document Revised: 12/10/2016 Document Reviewed: 11/17/2015 Elsevier Patient Education  2020 [employer].

## 2018-11-16 NOTE — Progress Notes (Signed)
Cardiology Office Note:    Date:  11/16/2018   ID:  Jasmin Hughes, DOB 02/01/1945, MRN [id number]  PCP:  Maylon Peppers, MD  Cardiologist:  Jenne Campus, MD    Referring MD: Maylon Peppers, MD   Chief Complaint  Patient presents with  . Follow-up    History of Present Illness:    Jasmin Hughes is a 73 y.o. female   paroxysmal atrial fibrillation, hypertension, GERD, tobacco abuse, bipolar disorder and COPD who presents for follow-up.  She was admitted 10/2016 with a COPD exacerbation.  She was also noted to be in atrial fibrillation with rapid ventricular response.  She was converted spontaneously to sinus rhythm while on IV diltiazem and beta-blocker.  Troponin was mildly elevated to 0.05, which was felt to be due to demand ischemia.  There were plans for inpatient stress testing but she had persistent wheezing so this was deferred to the outpatient setting.  The echo that admission revealed LVEF 60-65% with normal diastolic function.  Overall recently she seen my partner Dr. Cay Schillings who recommended fractional flow reserve as well as monitor.  She did not do any of those test.  She described to have some tightness in her chest sometimes related to exercise.  She did have a cardiac catheterization apparently 10 years ago which was normal.  Described to have weakness fatigue and shortness of breath with exertion but otherwise seems to be doing well.  There is no palpitations.  She is not anticoagulated  Past Medical History:  Diagnosis Date  . Iron deficiency anemia 03/04/2016  . 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  Current Medications: Current Meds  Medication Sig  . aspirin EC 81 MG tablet Take 81 mg by mouth daily.  Marland Kitchen diltiazem (CARDIZEM CD) 120 MG 24 hr capsule Take 1 capsule (120 mg total) by mouth daily.  . Ferrous Sulfate (IRON) 325 (65 Fe) MG TABS Take by mouth.  Marland Kitchen ibuprofen (ADVIL,MOTRIN) 800 MG tablet Take 800 mg by mouth every 8  (eight) hours as needed for mild pain.   . magnesium citrate SOLN Take 1 Bottle by mouth once.  . metoprolol tartrate (LOPRESSOR) 25 MG tablet Take 1 tablet (25 mg total) by mouth 2 (two) times daily.  . multivitamin-iron-minerals-folic acid (CENTRUM) chewable tablet Chew 1 tablet by mouth daily.  . Nutritional Supplements (VITAMIN D BOOSTER PO) Take by mouth.  . venlafaxine (EFFEXOR) 37.5 MG tablet Take 37.5 mg by mouth every evening.      Allergies:   Other and Erythromycin   Social History   Socioeconomic History  . Marital status: Married    Spouse name: Not on file  . Number of children: Not on file  . Years of education: Not on file  . Highest education level: Not on file  Occupational History  . Not on file  Social Needs  . Financial resource strain: Not on file  . Food insecurity    Worry: Not on file    Inability: Not on file  . Transportation needs    Medical: Not on file    Non-medical: Not on file  Tobacco Use  . Smoking status: Former Smoker    Packs/day: 0.25    Types: Cigarettes    Quit date: 11/02/2016    Years since quitting: 2.0  . Smokeless tobacco: Never Used  Substance and Sexual Activity  . Alcohol use: No  . Drug use: No  . Sexual activity: Not on  file  Lifestyle  . Physical activity    Days per week: Not on file    Minutes per session: Not on file  . Stress: Not on file  Relationships  . Social Herbalist on phone: Not on file    Gets together: Not on file    Attends religious service: Not on file    Active member of club or organization: Not on file    Attends meetings of clubs or organizations: Not on file    Relationship status: Not on file  Other Topics Concern  . Not on file  Social History Narrative  . Not on file     Family History: The patient's family history includes CAD in her father; Heart murmur in her sister; Hyperlipidemia in her brother, sister, and sister; Hypertension in her mother, sister, sister, and son;  Lupus in her sister; Mitral valve prolapse in her brother; Stomach cancer in her mother. ROS:   Please see the history of present illness.    All 14 point review of systems negative except as described per history of present illness  EKGs/Labs/Other Studies Reviewed:      Recent Labs: No results found for requested labs within last 8760 hours.  Recent Lipid Panel    Component Value Date/Time   CHOL 195 11/03/2016 0013   TRIG 42 11/03/2016 0013   HDL 74 11/03/2016 0013   CHOLHDL 2.6 11/03/2016 0013   VLDL 8 11/03/2016 0013   LDLCALC 113 (H) 11/03/2016 0013    Physical Exam:    VS:  BP 118/70   Pulse 71   Ht 5' 1.5" (1.562 m)   Wt 151 lb (68.5 kg)   SpO2 97%   BMI 28.07 kg/m     Wt Readings from Last 3 Encounters:  11/16/18 151 lb (68.5 kg)  09/14/18 153 lb (69.4 kg)  12/10/16 140 lb (63.5 kg)     GEN:  Well nourished, well developed in no acute distress HEENT: Normal NECK: No JVD; No carotid bruits LYMPHATICS: No lymphadenopathy CARDIAC: RRR, no murmurs, no rubs, no gallops RESPIRATORY:  Clear to auscultation without rales, wheezing or rhonchi  ABDOMEN: Soft, non-tender, non-distended MUSCULOSKELETAL:  No edema; No deformity  SKIN: Warm and dry LOWER EXTREMITIES: no swelling NEUROLOGIC:  Alert and oriented x 3 PSYCHIATRIC:  Normal affect   ASSESSMENT:    1. Atypical chest pain   2. Paroxysmal atrial fibrillation (HCC)   3. Dyspnea on exertion    PLAN:    In order of problems listed above:  1. Atypical chest pain.  We will schedule her to have fractional flow reserve.  We talked in length about this we discussed multiple issues finally she agree 2. Paroxysmal atrial fibrillation I will schedule her to have a monitor for about a week to see if there is any recurrences of atrial fibrillation if that is the case anticoagulation will be started 3. Dyspnea exertion fractional flow reserve will be done   Medication Adjustments/Labs and Tests Ordered: Current  medicines are reviewed at length with the patient today.  Concerns regarding medicines are outlined above.  No orders of the defined types were placed in this encounter.  Medication changes: No orders of the defined types were placed in this encounter.   Signed, Park Liter, MD, [facility] 11/16/2018 3:02 PM    [place]

## 2018-11-17 LAB — COMPREHENSIVE METABOLIC PANEL
ALT: 9 IU/L (ref 0–32)
AST: 19 IU/L (ref 0–40)
Albumin/Globulin Ratio: 1.7 (ref 1.2–2.2)
Albumin: 4.5 g/dL (ref 3.7–4.7)
Alkaline Phosphatase: 160 IU/L — ABNORMAL HIGH (ref 39–117)
BUN/Creatinine Ratio: 26 (ref 12–28)
BUN: 20 mg/dL (ref 8–27)
Bilirubin Total: 0.3 mg/dL (ref 0.0–1.2)
CO2: 27 mmol/L (ref 20–29)
Calcium: 9.5 mg/dL (ref 8.7–10.3)
Chloride: 101 mmol/L (ref 96–106)
Creatinine, Ser: 0.78 mg/dL (ref 0.57–1.00)
GFR calc Af Amer: 87 mL/min/{1.73_m2} (ref >59)
GFR calc non Af Amer: 76 mL/min/{1.73_m2} (ref >59)
Globulin, Total: 2.6 g/dL (ref 1.5–4.5)
Glucose: 85 mg/dL (ref 65–99)
Potassium: 4.4 mmol/L (ref 3.5–5.2)
Sodium: 141 mmol/L (ref 134–144)
Total Protein: 7.1 g/dL (ref 6.0–8.5)

## 2018-11-17 LAB — MAGNESIUM: Magnesium: 2 mg/dL (ref 1.6–2.3)

## 2018-11-22 ENCOUNTER — Telehealth: Payer: Self-pay | Admitting: Emergency Medicine

## 2018-11-22 NOTE — Telephone Encounter (Signed)
Left message for patient to return call.

## 2018-11-27 ENCOUNTER — Ambulatory Visit (INDEPENDENT_AMBULATORY_CARE_PROVIDER_SITE_OTHER): Payer: Medicare HMO

## 2018-11-27 DIAGNOSIS — R0789 Other chest pain: Secondary | ICD-10-CM | POA: Diagnosis not present

## 2018-11-27 DIAGNOSIS — I480 Paroxysmal atrial fibrillation: Secondary | ICD-10-CM

## 2018-11-27 DIAGNOSIS — R0600 Dyspnea, unspecified: Secondary | ICD-10-CM

## 2018-11-29 ENCOUNTER — Telehealth: Payer: Self-pay | Admitting: Cardiology

## 2018-11-29 NOTE — Telephone Encounter (Signed)
Pt. Requesting call back. She says that she is having an allergic reaction to the adhesives that are on the monitor she is wearing.

## 2018-11-29 NOTE — Telephone Encounter (Signed)
Telephone call to patient. States having itching from monitor that she placed last Sunday 11/23/18. Informed her to try oral benadryl  for the itching and try to keep on for the 7 days prescribed. If unable to tolerate to send in the monitor early.Pt agrees to plan.

## 2018-12-28 ENCOUNTER — Telehealth: Payer: Self-pay | Admitting: Emergency Medicine

## 2018-12-28 NOTE — Telephone Encounter (Signed)
Left message for patient to return call regarding results  

## 2019-01-10 ENCOUNTER — Ambulatory Visit: Payer: Medicare HMO | Admitting: Cardiology

## 2019-01-29 ENCOUNTER — Ambulatory Visit (HOSPITAL_COMMUNITY): Payer: Medicare HMO

## 2019-05-04 ENCOUNTER — Other Ambulatory Visit: Payer: Self-pay

## 2019-05-04 ENCOUNTER — Encounter: Payer: Self-pay | Admitting: Cardiology

## 2019-05-04 ENCOUNTER — Ambulatory Visit: Payer: Medicare HMO | Admitting: Cardiology

## 2019-05-04 VITALS — BP 144/84 | HR 70 | Ht 61.5 in | Wt 151.0 lb

## 2019-05-04 DIAGNOSIS — R0600 Dyspnea, unspecified: Secondary | ICD-10-CM | POA: Diagnosis not present

## 2019-05-04 DIAGNOSIS — I4891 Unspecified atrial fibrillation: Secondary | ICD-10-CM

## 2019-05-04 DIAGNOSIS — R0609 Other forms of dyspnea: Secondary | ICD-10-CM

## 2019-05-04 NOTE — Patient Instructions (Signed)
Medication Instructions:  Your physician recommends that you continue on your current medications as directed. Please refer to the Current Medication list given to you today.  *If you need a refill on your cardiac medications before your next appointment, please call your pharmacy*   Lab Work: Your physician recommends that you have FASTING lipid profile today.    If you have labs (blood work) drawn today and your tests are completely normal, you will receive your results only by: Marland Kitchen MyChart Message (if you have MyChart) OR . A paper copy in the mail If you have any lab test that is abnormal or we need to change your treatment, we will call you to review the results.   Testing/Procedures: -None   Follow-Up: At [facility], you and your health needs are our priority.  As part of our continuing mission to provide you with exceptional heart care, we have created designated Provider Care Teams.  These Care Teams include your primary Cardiologist (physician) and Advanced Practice Providers (APPs -  Physician Assistants and Nurse Practitioners) who all work together to provide you with the care you need, when you need it.  We recommend signing up for the patient portal called "MyChart".  Sign up information is provided on this After Visit Summary.  MyChart is used to connect with patients for Virtual Visits (Telemedicine).  Patients are able to view lab/test results, encounter notes, upcoming appointments, etc.  Non-urgent messages can be sent to your provider as well.   To learn more about what you can do with MyChart, go to [email or web address].    Your next appointment:   4 month(s)  The format for your next appointment:   In Person  Provider:   Jenne Campus, MD   Other Instructions

## 2019-05-06 NOTE — Progress Notes (Signed)
Cardiology Office Note:    Date:  05/06/2019   ID:  Jasmin Hughes, DOB [birth year], MRN [id number]  PCP:  Maylon Peppers, MD  Cardiologist:  Jenne Campus, MD    Referring MD: Maylon Peppers, MD   Chief Complaint  Patient presents with  . Follow-up  : Atypical chest pain  History of Present Illness:    Jasmin Hughes is a 74 y.o. female   paroxysmal atrial fibrillation, hypertension, GERD, tobacco abuse, bipolar disorder and COPD who presents for follow-up. She was admitted 10/2016 with a COPD exacerbation. She was also noted to be in atrial fibrillation with rapid ventricular response. She was converted spontaneously to sinus rhythm while on IV diltiazem and beta-blocker. Troponin was mildly elevated to 0.05, which was felt to be due to demand ischemia. There were plans for inpatient stress testing but she had persistent wheezing so this was deferred to the outpatient setting. The echo that admission revealed LVEF 60-65% with normal diastolic function.  Overall recently she seen my partner Dr. Humphrey Rolls who recommended fractional flow reserve as well as monitor.  She did not do any of those test.  She described to have some tightness in her chest sometimes related to exercise.  She did have a cardiac catheterization apparently 10 years ago which was normal.  Described to have weakness fatigue and shortness of breath with exertion but otherwise seems to be doing well.  There is no palpitations.   She comes today 2 months of follow-up overall seems to be doing well.  Still did not have fractional flow reserve done.  She says she is feeling well and she does not wish to have it.  Past Medical History:  Diagnosis Date  . Iron deficiency anemia 03/04/2016  . 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 No pertinent surgical history.  Current Medications: Current Meds  Medication Sig  . aspirin EC 81 MG tablet Take 81 mg by mouth daily.  Marland Kitchen diltiazem (CARDIZEM CD)  120 MG 24 hr capsule Take 1 capsule (120 mg total) by mouth daily.  . Ferrous Sulfate (IRON) 325 (65 Fe) MG TABS Take by mouth.  . Glycopyrrolate-Formoterol (BEVESPI AEROSPHERE) 9-4.8 MCG/ACT AERO INHALE 2 PUFFS INTO THE LUNGS TWO TIMES A DAY  . ibuprofen (ADVIL,MOTRIN) 800 MG tablet Take 800 mg by mouth every 8 (eight) hours as needed for mild pain.   . magnesium citrate SOLN Take 1 Bottle by mouth once.  . metoprolol tartrate (LOPRESSOR) 25 MG tablet Take 1 tablet (25 mg total) by mouth 2 (two) times daily.  . multivitamin-iron-minerals-folic acid (CENTRUM) chewable tablet Chew 1 tablet by mouth daily.  . Nutritional Supplements (VITAMIN D BOOSTER PO) Take by mouth.  . venlafaxine (EFFEXOR) 37.5 MG tablet Take 37.5 mg by mouth every evening.      Allergies:   Other and Erythromycin   Social History   Socioeconomic History  . Marital status: Married    Spouse name: Not on file  . Number of children: Not on file  . Years of education: Not on file  . Highest education level: Not on file  Occupational History  . Not on file  Tobacco Use  . Smoking status: Former Smoker    Packs/day: 0.25    Types: Cigarettes    Quit date: 11/02/2016    Years since quitting: 2.5  . Smokeless tobacco: Never Used  Substance and Sexual Activity  . Alcohol use: No  . Drug use: No  . Sexual activity:  Not on file  Other Topics Concern  . Not on file  Social History Narrative  . Not on file   Social Determinants of Health   Financial Resource Strain:   . Difficulty of Paying Living Expenses:   Food Insecurity:   . Worried About Charity fundraiser in the Last Year:   . Arboriculturist in the Last Year:   Transportation Needs:   . Film/video editor (Medical):   Marland Kitchen Lack of Transportation (Non-Medical):   Physical Activity:   . Days of Exercise per Week:   . Minutes of Exercise per Session:   Stress:   . Feeling of Stress :   Social Connections:   . Frequency of Communication with  Friends and Family:   . Frequency of Social Gatherings with Friends and Family:   . Attends Religious Services:   . Active Member of Clubs or Organizations:   . Attends Archivist Meetings:   Marland Kitchen Marital Status:      Family History: The patient's family history includes CAD in her father; Heart murmur in her sister; Hyperlipidemia in her brother, sister, and sister; Hypertension in her mother, sister, sister, and son; Lupus in her sister; Mitral valve prolapse in her brother; Stomach cancer in her mother. ROS:   Please see the history of present illness.    All 14 point review of systems negative except as described per history of present illness  EKGs/Labs/Other Studies Reviewed:      Recent Labs: 11/16/2018: ALT 9; BUN 20; Creatinine, Ser 0.78; Magnesium 2.0; Potassium 4.4; Sodium 141  Recent Lipid Panel    Component Value Date/Time   CHOL 195 11/03/2016 0013   TRIG 42 11/03/2016 0013   HDL 74 11/03/2016 0013   CHOLHDL 2.6 11/03/2016 0013   VLDL 8 11/03/2016 0013   LDLCALC 113 (H) 11/03/2016 0013    Physical Exam:    VS:  BP (!) 144/84   Pulse 70   Ht 5' 1.5" (1.562 m)   Wt 151 lb (68.5 kg)   SpO2 96%   BMI 28.07 kg/m     Wt Readings from Last 3 Encounters:  05/04/19 151 lb (68.5 kg)  11/16/18 151 lb (68.5 kg)  09/14/18 153 lb (69.4 kg)     GEN:  Well nourished, well developed in no acute distress HEENT: Normal NECK: No JVD; No carotid bruits LYMPHATICS: No lymphadenopathy CARDIAC: RRR, no murmurs, no rubs, no gallops RESPIRATORY:  Clear to auscultation without rales, wheezing or rhonchi  ABDOMEN: Soft, non-tender, non-distended MUSCULOSKELETAL:  No edema; No deformity  SKIN: Warm and dry LOWER EXTREMITIES: no swelling NEUROLOGIC:  Alert and oriented x 3 PSYCHIATRIC:  Normal affect   ASSESSMENT:    1. Atrial fibrillation with RVR ([place])   2. Dyspnea on exertion    PLAN:    In order of problems listed above:  1. Atrial fibrillation.  Not  anticoagulated.  She had only one episode of atrial fibrillation when she was in the hospital sick with COPD.  She did wear long term monitor showed some narrow complex tachycardia but no atrial fibrillation.  We revisit the issue of potential anticoagulation she does not want to do it.  On top of that she does have some history of anemia. 2. Dyspnea on exertion I will check her proBNP.   Medication Adjustments/Labs and Tests Ordered: Current medicines are reviewed at length with the patient today.  Concerns regarding medicines are outlined above.  Orders Placed This  Encounter  Procedures  . Lipid Profile   Medication changes: No orders of the defined types were placed in this encounter.   Signed, Park Liter, MD, [facility] 05/06/2019 7:15 PM    [place]

## 2019-05-08 LAB — LIPID PANEL
Chol/HDL Ratio: 3 ratio (ref 0.0–4.4)
Cholesterol, Total: 208 mg/dL — ABNORMAL HIGH (ref 100–199)
HDL: 70 mg/dL (ref >39)
LDL Chol Calc (NIH): 119 mg/dL — ABNORMAL HIGH (ref 0–99)
Triglycerides: 109 mg/dL (ref 0–149)
VLDL Cholesterol Cal: 19 mg/dL (ref 5–40)

## 2019-05-09 ENCOUNTER — Telehealth: Payer: Self-pay

## 2019-05-09 NOTE — Telephone Encounter (Signed)
-----   Message from Park Liter, MD sent at 05/09/2019  3:29 PM EDT ----- Cholesterol mildly elevated but HDL is very good.  We will continue present management

## 2019-05-09 NOTE — Telephone Encounter (Signed)
The patient has been notified of the result and verbalized understanding.  All questions (if any) were answered. Wilma Flavin, RN 05/09/2019 3:57 PM

## 2019-05-30 IMAGING — CT CT ANGIO CHEST
2 of 7 series · 19 of 36 positions shown · IV contrast (isovue)
Comparison: Chest radiograph performed earlier today at [DATE] a.m.

CLINICAL DATA: Acute onset of severe shortness of breath. Elevated
D-dimer. Iron deficiency anemia. Initial encounter.

EXAM:
CT ANGIOGRAPHY CHEST WITH CONTRAST
TECHNIQUE: Multidetector CT imaging of the chest was performed using the
standard protocol during bolus administration of intravenous
contrast. Multiplanar CT image reconstructions and MIPs were
obtained to evaluate the vascular anatomy.
CONTRAST:  100 mL of Isovue 370 IV contrast

[Series 8: pe thins · axial · 0.57mm/px · z∈[+1054,+1324]mm · 18 of 301 slices shown]
[im 16/301  lung]
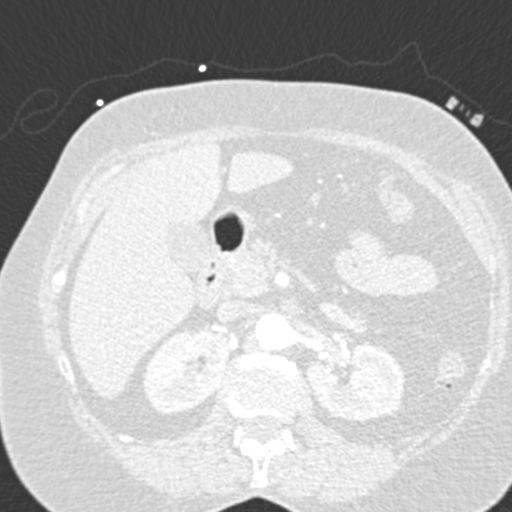
[im 31/301  mediastinal]
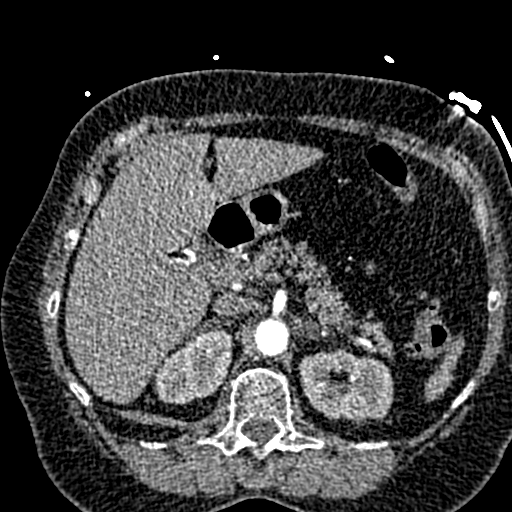
[im 46/301  lung]
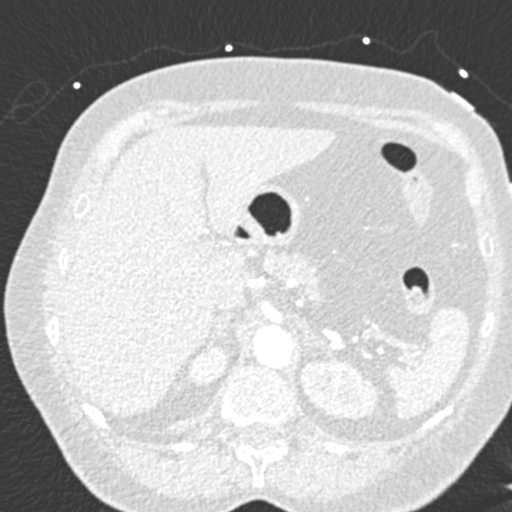
[im 61/301  mediastinal]
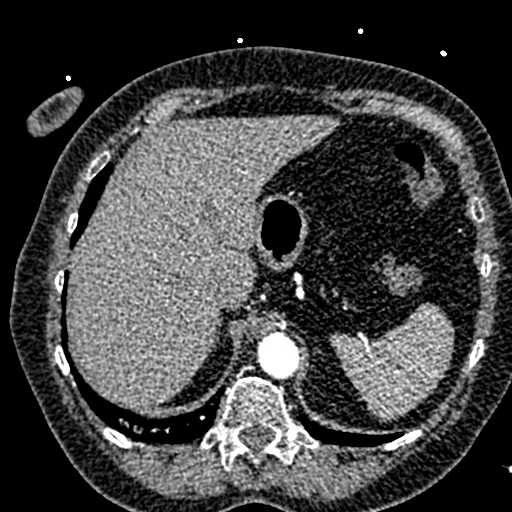
[im 76/301  lung]
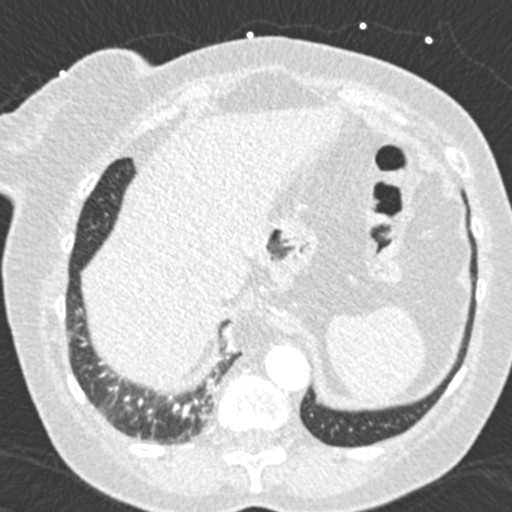
[im 91/301  mediastinal]
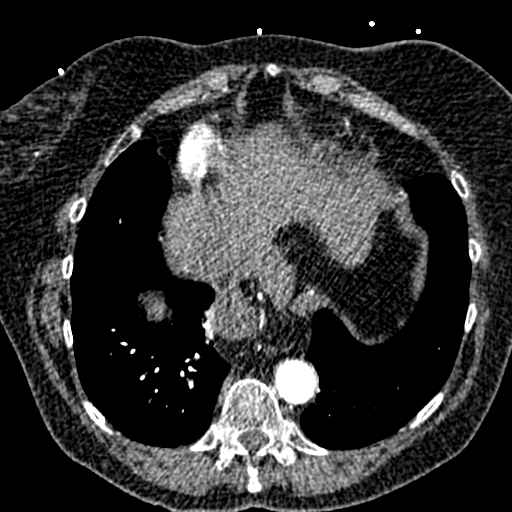
[im 106/301  lung]
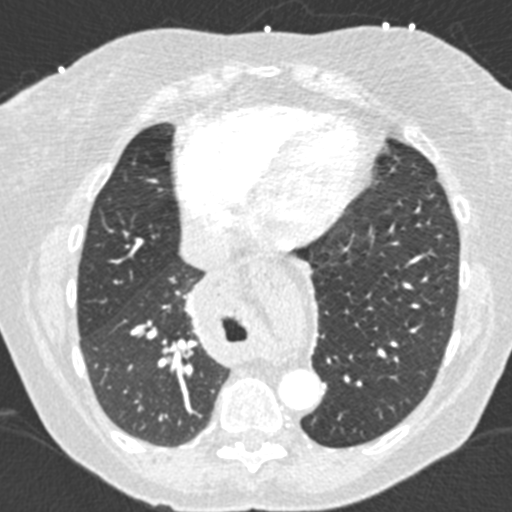
[im 121/301  mediastinal]
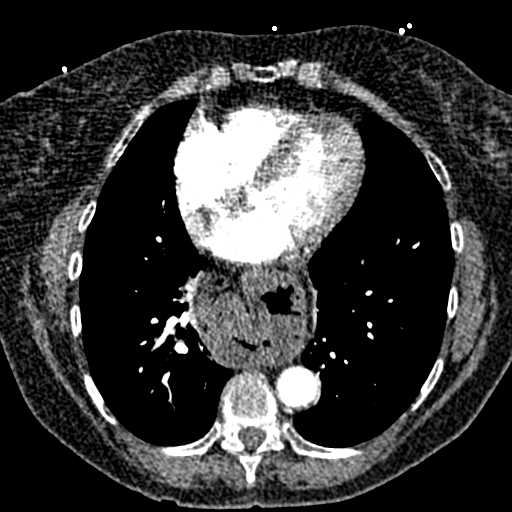
[im 136/301  lung]
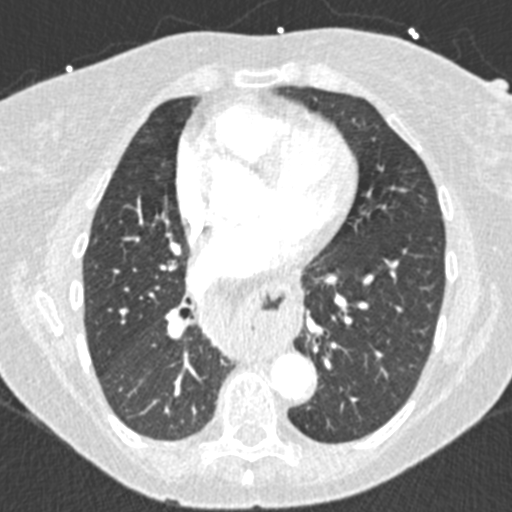
[im 166/301  mediastinal]
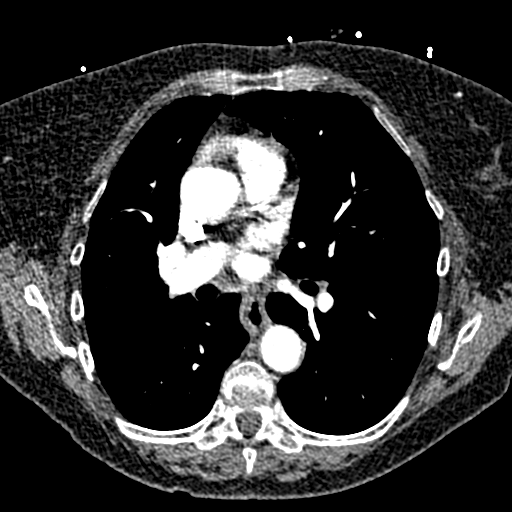
[im 181/301  lung]
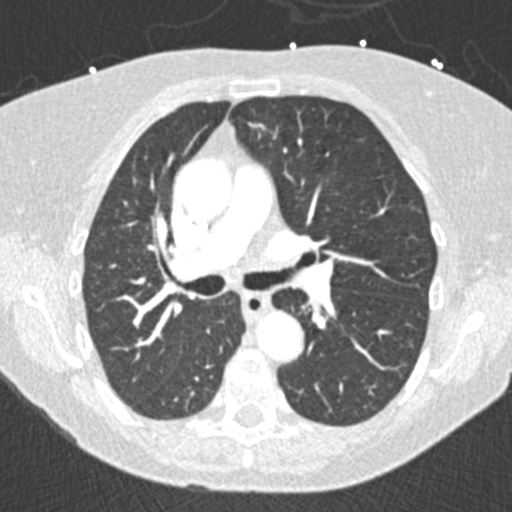
[im 196/301  mediastinal]
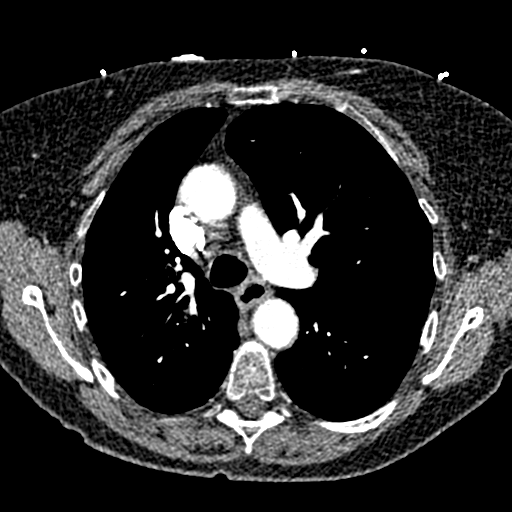
[im 211/301  lung]
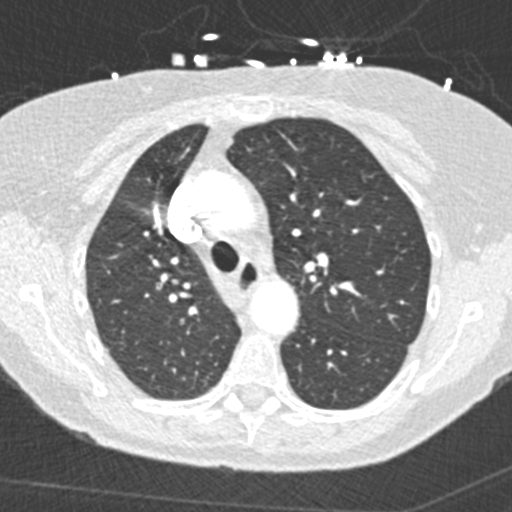
[im 226/301  mediastinal]
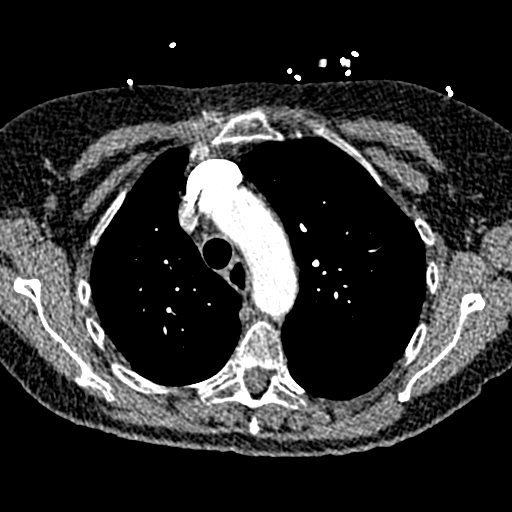
[im 241/301  lung]
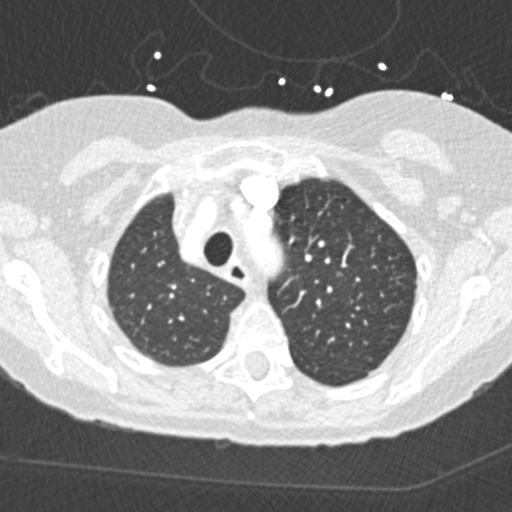
[im 256/301  mediastinal]
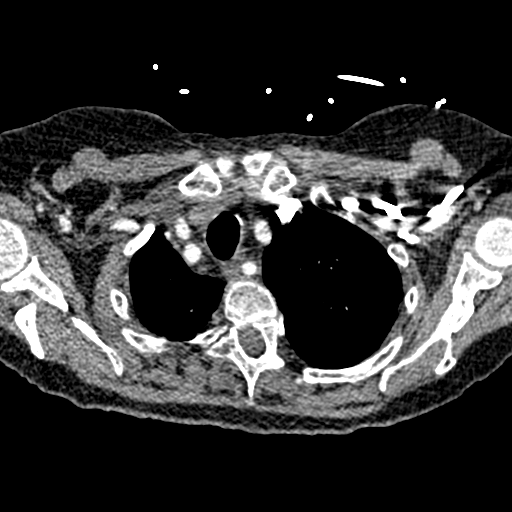
[im 271/301  lung]
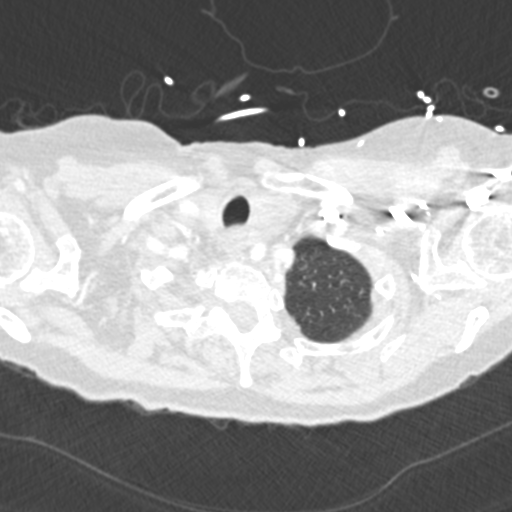
[im 286/301  mediastinal]
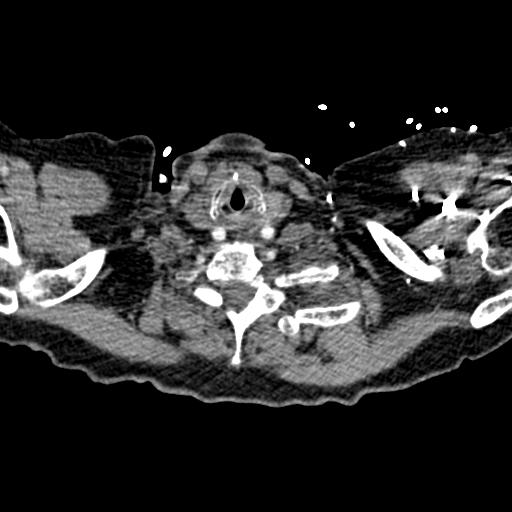

[Series 9: pe coronal mpr · coronal · 0.59mm/px · 1 of 124 slices shown]
[im 62/124  mediastinal]
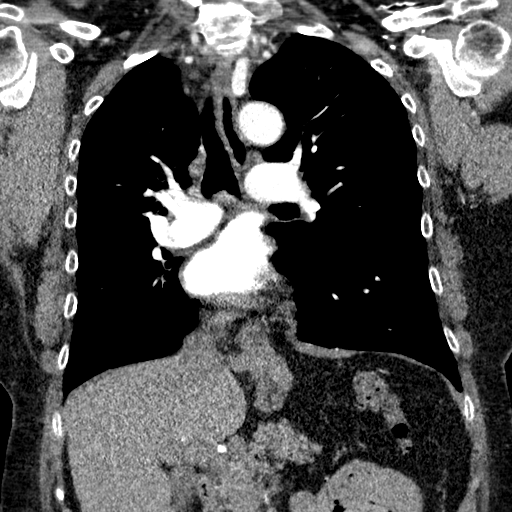

[19 of 36 positions shown; findings below may reference images not displayed]

FINDINGS: Cardiovascular:  There is no evidence of pulmonary embolus.

The heart remains normal in size. Mild calcification is noted along
the aortic arch. The great vessels are unremarkable in appearance.

Mediastinum/Nodes: Scattered coronary artery calcifications are
seen. A large hiatal hernia is noted. No mediastinal lymphadenopathy
is seen. No pericardial effusion is identified. The thyroid is
unremarkable in appearance. No axillary lymphadenopathy is
appreciated.

Lungs/Pleura: Minimal right basilar atelectasis or scarring is
noted. No pleural effusion or pneumothorax is seen. No masses are
identified.

Upper Abdomen: The visualized portions of the liver and spleen are
unremarkable. The visualized portions of the gallbladder, pancreas,
adrenal glands and kidneys are within normal limits.

Musculoskeletal: No acute osseous abnormalities are identified. The
visualized musculature is unremarkable in appearance.

Review of the MIP images confirms the above findings.
IMPRESSION: 1. No evidence of pulmonary embolus.
2. Minimal right basilar atelectasis or scarring. Lungs otherwise
clear.
3. Scattered coronary artery calcifications seen.
4. Large hiatal hernia noted.

## 2019-05-30 IMAGING — DX DG CHEST 1V PORT
1 series · 1 of 1 positions shown · non-contrast
Comparison: None.

CLINICAL DATA: 70 y/o  F; shortness of breath.

EXAM:
PORTABLE CHEST 1 VIEW

[chest ap]
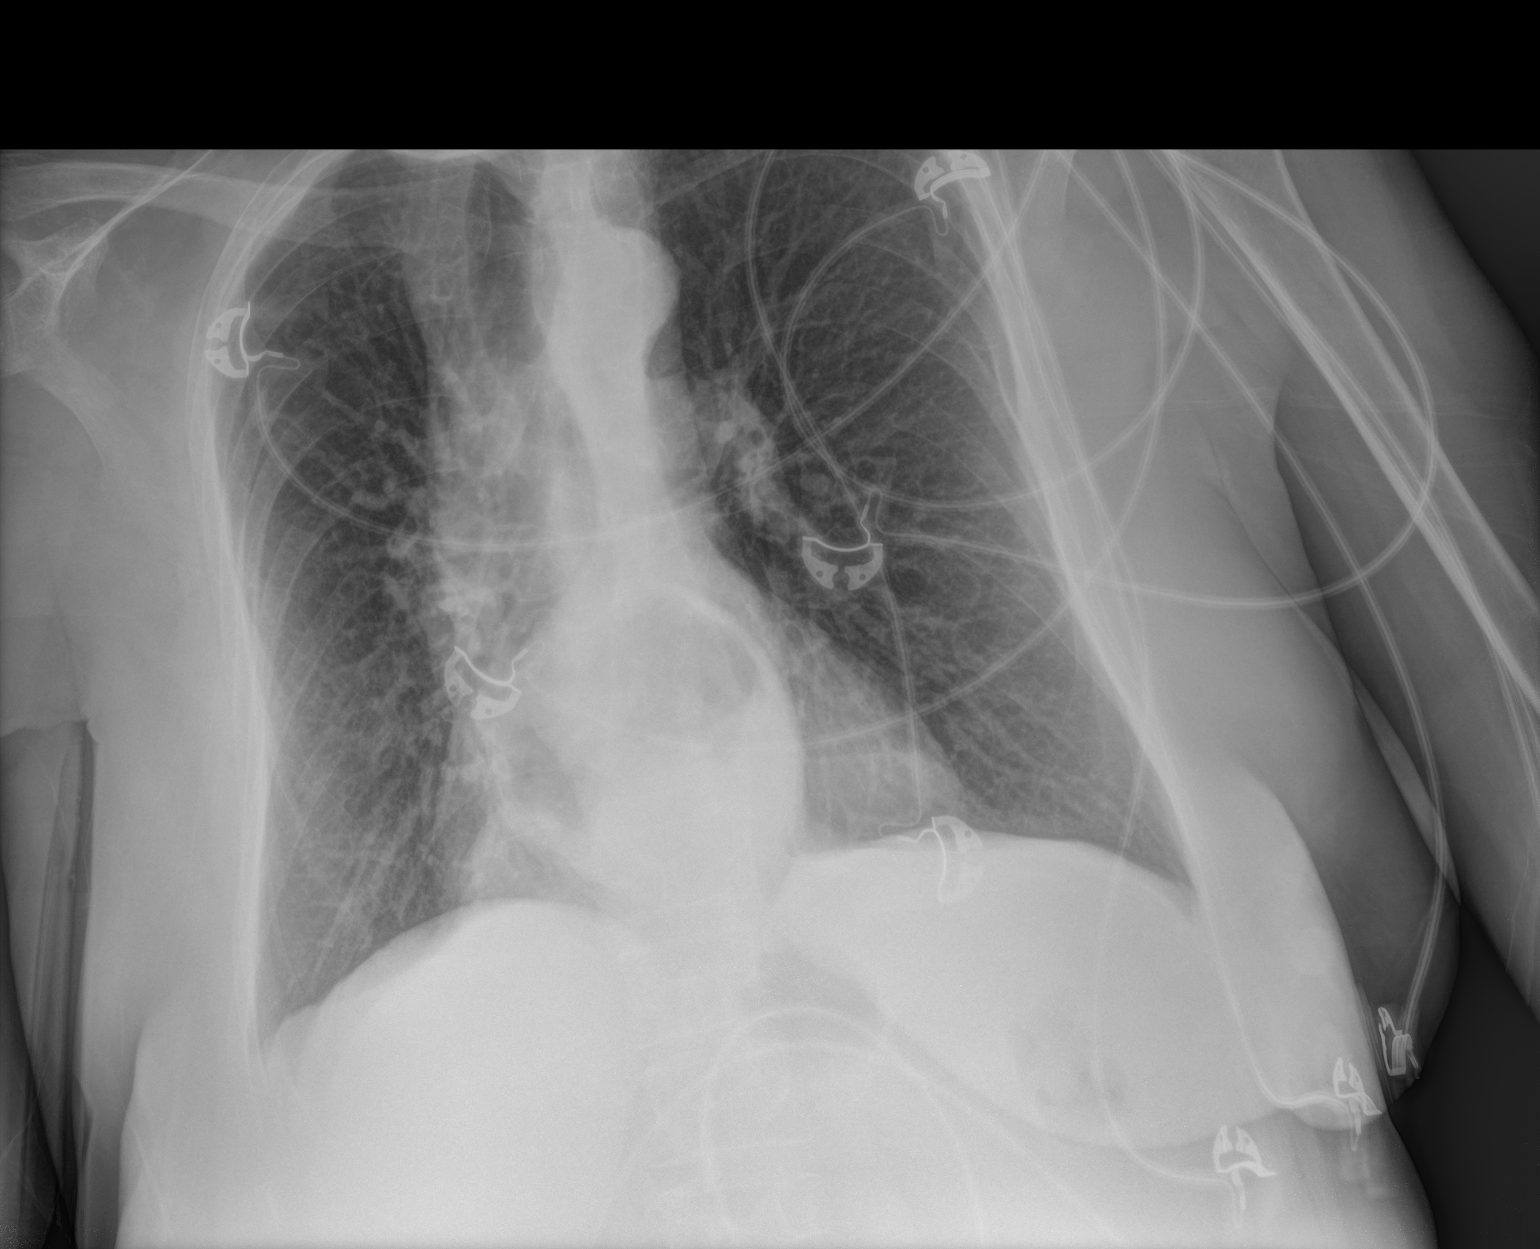

[1 of 1 positions shown; findings below may reference images not displayed]

FINDINGS: Normal cardiac silhouette given projection and technique. Large
hiatal hernia. No focal consolidation, effusion, or pneumothorax.
Bones are unremarkable.
IMPRESSION: Large hiatal hernia.  No acute pulmonary process identified.

By: Shalley Jim M.D.

## 2019-09-05 ENCOUNTER — Ambulatory Visit: Payer: Medicare HMO | Admitting: Cardiology

## 2019-10-01 ENCOUNTER — Ambulatory Visit: Payer: Medicare HMO | Admitting: Cardiology

## 2019-11-05 ENCOUNTER — Ambulatory Visit: Payer: Medicare HMO | Admitting: Cardiology

## 2019-11-05 ENCOUNTER — Encounter: Payer: Self-pay | Admitting: Cardiology

## 2019-11-05 ENCOUNTER — Other Ambulatory Visit: Payer: Self-pay

## 2019-11-05 VITALS — BP 124/70 | HR 66 | Ht 61.5 in | Wt 152.0 lb

## 2019-11-05 DIAGNOSIS — R0789 Other chest pain: Secondary | ICD-10-CM | POA: Diagnosis not present

## 2019-11-05 DIAGNOSIS — R0600 Dyspnea, unspecified: Secondary | ICD-10-CM

## 2019-11-05 DIAGNOSIS — I480 Paroxysmal atrial fibrillation: Secondary | ICD-10-CM | POA: Diagnosis not present

## 2019-11-05 DIAGNOSIS — R0609 Other forms of dyspnea: Secondary | ICD-10-CM

## 2019-11-05 NOTE — Progress Notes (Signed)
Cardiology Office Note:    Date:  11/05/2019   ID:  Jasmin Hughes, DOB 1945-08-02, MRN [id number]  PCP:  Cathlean Sauer, MD  Cardiologist:  Jenne Campus, MD    Referring MD: Maylon Peppers, MD   No chief complaint on file. Doing well  History of Present Illness:    Jasmin Hughes is a 74 y.o. female  paroxysmal atrial fibrillation, hypertension, GERD, tobacco abuse, bipolar disorder and COPD who presents for follow-up. She was admitted 10/2016 with a COPD exacerbation. She was also noted to be in atrial fibrillation with rapid ventricular response. She was converted spontaneously to sinus rhythm while on IV diltiazem and beta-blocker. Troponin was mildly elevated to 0.05, which was felt to be due to demand ischemia. There were plans for inpatient stress testing but she had persistent wheezing so this was deferred to the outpatient setting. The echo that admission revealed LVEF 60-65% with normal diastolic function.Overall recently she seen my partner Dr. Humphrey Rolls who recommended fractional flow reserve as well as monitor. She did not do any of those test. She described to have some tightness in her chest sometimes related to exercise. She did have a cardiac catheterization apparently 10 years ago which was normal. Described to have weakness fatigue and shortness of breath with exertion but otherwise seems to be doing well. There is no palpitations.  She comes today to my office for follow-up she is doing well to the discomfort she has in fact that her husband is sick and he does not want to do anything she thinks he is depressed and basically we spent majority of the visit she talks about him described to have rare palpitation when she get upset.  Denies have any chest pain tightness squeezing pressure burning chest.  Past Medical History:  Diagnosis Date  . Acute on chronic respiratory failure with hypoxia ([place]) 11/03/2016  . Atrial fibrillation with RVR ([place])  11/02/2016  . Atypical chest pain 11/16/2018  . COPD exacerbation ([place]) 11/02/2016  . Dyspnea on exertion 11/16/2018  . Hypoxia   . Iron deficiency anemia 03/04/2016  . 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 Paroxysmal atrial fibrillation ([place]) 11/16/2018    Past Surgical History:  Procedure Laterality Date  . HIATAL HERNIA REPAIR    . PARTIAL HYSTERECTOMY      Current Medications: Current Meds  Medication Sig  . albuterol (VENTOLIN HFA) 108 (90 Base) MCG/ACT inhaler Inhale into the lungs.  Marland Kitchen diltiazem (CARDIZEM CD) 120 MG 24 hr capsule Take 1 capsule (120 mg total) by mouth daily.  . Ferrous Sulfate (IRON) 325 (65 Fe) MG TABS Take by mouth.  . Glycopyrrolate-Formoterol (BEVESPI AEROSPHERE) 9-4.8 MCG/ACT AERO INHALE 2 PUFFS INTO THE LUNGS TWO TIMES A DAY  . ibuprofen (ADVIL,MOTRIN) 800 MG tablet Take 800 mg by mouth every 8 (eight) hours as needed for mild pain.   . metoprolol tartrate (LOPRESSOR) 25 MG tablet Take 1 tablet (25 mg total) by mouth 2 (two) times daily.  . Nutritional Supplements (VITAMIN D BOOSTER PO) Take by mouth.  . venlafaxine (EFFEXOR) 37.5 MG tablet Take 37.5 mg by mouth every evening.      Allergies:   Other and Erythromycin   Social History   Socioeconomic History  . Marital status: Married    Spouse name: Not on file  . Number of children: Not on file  . Years of education: Not on file  . Highest education level: Not on file  Occupational History  . Not on file  Tobacco Use  . Smoking status: Former Smoker    Packs/day: 0.25    Types: Cigarettes    Quit date: 11/02/2016    Years since quitting: 3.0  . Smokeless tobacco: Never Used  Vaping Use  . Vaping Use: Never used  Substance and Sexual Activity  . Alcohol use: No  . Drug use: No  . Sexual activity: Not on file  Other Topics Concern  . Not on file  Social History Narrative  . Not on file   Social Determinants of Health   Financial Resource Strain:   . Difficulty of Paying Living Expenses:  Not on file  Food Insecurity:   . Worried About Charity fundraiser in the Last Year: Not on file  . Ran Out of Food in the Last Year: Not on file  Transportation Needs:   . Lack of Transportation (Medical): Not on file  . Lack of Transportation (Non-Medical): Not on file  Physical Activity:   . Days of Exercise per Week: Not on file  . Minutes of Exercise per Session: Not on file  Stress:   . Feeling of Stress : Not on file  Social Connections:   . Frequency of Communication with Friends and Family: Not on file  . Frequency of Social Gatherings with Friends and Family: Not on file  . Attends Religious Services: Not on file  . Active Member of Clubs or Organizations: Not on file  . Attends Archivist Meetings: Not on file  . Marital Status: Not on file     Family History: The patient's family history includes CAD in her father; Heart murmur in her sister; Hyperlipidemia in her brother, sister, and sister; Hypertension in her mother, sister, sister, and son; Lupus in her sister; Mitral valve prolapse in her brother; Stomach cancer in her mother. ROS:   Please see the history of present illness.    All 14 point review of systems negative except as described per history of present illness  EKGs/Labs/Other Studies Reviewed:    EKG today showed normal sinus rhythm normal P interval normal QS complex duration morphology no ST segment changes Recent Labs: 11/16/2018: ALT 9; BUN 20; Creatinine, Ser 0.78; Magnesium 2.0; Potassium 4.4; Sodium 141  Recent Lipid Panel    Component Value Date/Time   CHOL 208 (H) 05/07/2019 1125   TRIG 109 05/07/2019 1125   HDL 70 05/07/2019 1125   CHOLHDL 3.0 05/07/2019 1125   CHOLHDL 2.6 11/03/2016 0013   VLDL 8 11/03/2016 0013   LDLCALC 119 (H) 05/07/2019 1125    Physical Exam:    VS:  BP 124/70   Pulse 66   Ht 5' 1.5" (1.562 m)   Wt 152 lb (68.9 kg)   SpO2 94%   BMI 28.26 kg/m     Wt Readings from Last 3 Encounters:  11/05/19 152  lb (68.9 kg)  05/04/19 151 lb (68.5 kg)  11/16/18 151 lb (68.5 kg)     GEN:  Well nourished, well developed in no acute distress HEENT: Normal NECK: No JVD; No carotid bruits LYMPHATICS: No lymphadenopathy CARDIAC: RRR, no murmurs, no rubs, no gallops RESPIRATORY:  Clear to auscultation without rales, wheezing or rhonchi  ABDOMEN: Soft, non-tender, non-distended MUSCULOSKELETAL:  No edema; No deformity  SKIN: Warm and dry LOWER EXTREMITIES: no swelling NEUROLOGIC:  Alert and oriented x 3 PSYCHIATRIC:  Normal affect   ASSESSMENT:    1. Paroxysmal atrial fibrillation ([facility])   2. Dyspnea on exertion   3. Atypical  chest pain    PLAN:    In order of problems listed above:  1. Paroxysmal atrial fibrillation only 1 episode when she got episodes of exacerbation of COPD.  She is not anticoagulant she does not want to be.  I told her to let me know if she will have some palpitations that would suggest atrial fibrillation. 2. Dyspnea on exertion, probably multifactorial.  She does have significant COPD which undoubtedly contributed to this.  Her ejection fraction was normal before. 3. Atypical chest pain: Denies having any.  She is supposed to have a test done but she did not want to do it.  Therefore we will continue present management. 4. Dyslipidemia: I talked to her about cholesterol I did review this from [facility] from May 07, 2018 when LDL was 119 and HDL was 70.  I suggested cholesterol medication she was very reluctant.  With her good HDL I think we can continue present management.   Medication Adjustments/Labs and Tests Ordered: Current medicines are reviewed at length with the patient today.  Concerns regarding medicines are outlined above.  No orders of the defined types were placed in this encounter.  Medication changes: No orders of the defined types were placed in this encounter.   Signed, Park Liter, MD, [facility] 11/05/2019 3:35 PM    [facility]

## 2019-11-05 NOTE — Patient Instructions (Signed)

## 2020-03-26 ENCOUNTER — Ambulatory Visit: Payer: Medicare HMO | Admitting: Cardiology

## 2020-04-14 ENCOUNTER — Other Ambulatory Visit: Payer: Self-pay

## 2020-04-16 ENCOUNTER — Other Ambulatory Visit: Payer: Self-pay

## 2020-04-16 ENCOUNTER — Encounter: Payer: Self-pay | Admitting: Cardiology

## 2020-04-16 ENCOUNTER — Ambulatory Visit: Payer: Medicare HMO | Admitting: Cardiology

## 2020-04-16 VITALS — BP 136/72 | HR 74 | Ht 61.75 in | Wt 152.0 lb

## 2020-04-16 DIAGNOSIS — I480 Paroxysmal atrial fibrillation: Secondary | ICD-10-CM

## 2020-04-16 DIAGNOSIS — E782 Mixed hyperlipidemia: Secondary | ICD-10-CM

## 2020-04-16 DIAGNOSIS — R0789 Other chest pain: Secondary | ICD-10-CM

## 2020-04-16 NOTE — Addendum Note (Signed)
Addended by: Senaida Ores on: 04/16/2020 02:51 PM   Modules accepted: Orders

## 2020-04-16 NOTE — Patient Instructions (Signed)
Medication Instructions:  Your physician recommends that you continue on your current medications as directed. Please refer to the Current Medication list given to you today.  *If you need a refill on your cardiac medications before your next appointment, please call your pharmacy*   Lab Work: Your physician recommends that you return for lab work today: bmp, cbc, ifob  If you have labs (blood work) drawn today and your tests are completely normal, you will receive your results only by: Marland Kitchen MyChart Message (if you have MyChart) OR . A paper copy in the mail If you have any lab test that is abnormal or we need to change your treatment, we will call you to review the results.   Testing/Procedures: None   Follow-Up: At [facility], you and your health needs are our priority.  As part of our continuing mission to provide you with exceptional heart care, we have created designated Provider Care Teams.  These Care Teams include your primary Cardiologist (physician) and Advanced Practice Providers (APPs -  Physician Assistants and Nurse Practitioners) who all work together to provide you with the care you need, when you need it.  We recommend signing up for the patient portal called "MyChart".  Sign up information is provided on this After Visit Summary.  MyChart is used to connect with patients for Virtual Visits (Telemedicine).  Patients are able to view lab/test results, encounter notes, upcoming appointments, etc.  Non-urgent messages can be sent to your provider as well.   To learn more about what you can do with MyChart, go to [email or web address].    Your next appointment:   6 month(s)  The format for your next appointment:   In Person  Provider:   Jenne Campus, MD   Other Instructions

## 2020-04-16 NOTE — Progress Notes (Signed)
Cardiology Office Note:    Date:  04/16/2020   ID:  Jasmin Hughes, DOB 1945-03-08, MRN [id number]  PCP:  Cathlean Sauer, MD  Cardiologist:  Jenne Campus, MD    Referring MD: Cathlean Sauer, MD   Chief Complaint  Patient presents with  . Shortness of Breath    History of Present Illness:    Jasmin Hughes is a 75 y.o. female with past medical history significant for paroxysmal atrial fibrillation so for 1 documented episode of atrial fibrillation, she was refusing anticoagulation, essential hypertension, GERD, bipolar disorder, COPD.  Comes today to my office follow-up f follow-up overall she is doing fine described to have some shortness of breath but she tells me she realized that the problem smoking that she did before.  If she cannot accept the fact that she will be somewhat short of breath.  Tells me when she gets short of breath she will also feel her heart speeding up but cannot tell me if she got any recurrences of atrial fibrillation  Past Medical History:  Diagnosis Date  . Acute on chronic respiratory failure with hypoxia ([place]) 11/03/2016  . Anxiety 12/12/2013  . Atrial fibrillation with RVR ([place]) 11/02/2016  . Atypical chest pain 11/16/2018  . Bipolar disorder ([place]) 12/12/2013  . Cerebrovascular disease 09/23/2015  . COPD exacerbation ([place]) 11/02/2016  . COPD, moderate ([place]) 11/02/2016  . Costochondritis 09/03/2013  . Diverticulitis of colon 12/12/2013  . Dyspnea on exertion 11/16/2018  . Elevated alkaline phosphatase level 11/19/2014  . Fall down stairs 12/31/2013  . Fatty liver 12/22/2015  . GERD (gastroesophageal reflux disease) 09/03/2013  . Hiatal hernia 05/04/2012  . Hyperlipemia 12/12/2013  . Hypoxia   . Iron deficiency anemia 03/04/2016  . Iron deficiency anemia secondary to inadequate dietary iron intake 01/13/2016  . 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 Left flank pain 07/30/2013  . Low back pain 09/03/2013  . Mild cognitive impairment 10/20/2016  .  Osteoporosis 12/12/2013  . Overweight 01/24/2017  . Paroxysmal atrial fibrillation ([place]) 11/16/2018  . Pharyngitis 11/22/2014  . 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 Pseudophakia of both eyes 04/12/2017  . Renal insufficiency 11/22/2014  . Spasm of cervical paraspinous muscle 06/16/2015    Past Surgical History:  Procedure Laterality Date  . HIATAL HERNIA REPAIR    . PARTIAL HYSTERECTOMY      Current Medications: Current Meds  Medication Sig  . albuterol (VENTOLIN HFA) 108 (90 Base) MCG/ACT inhaler Inhale 2 puffs into the lungs every 6 (six) hours as needed for wheezing.  . clobetasol cream (TEMOVATE) 4.74 % Apply 2 application topically 2 (two) times a week.  . diltiazem (CARDIZEM CD) 120 MG 24 hr capsule Take 1 capsule (120 mg total) by mouth daily.  Mariane Baumgarten Sodium (DSS) 100 MG CAPS Take 1 capsule by mouth in the morning and at bedtime.  . Ferrous Sulfate (IRON) 325 (65 Fe) MG TABS Take 1 tablet by mouth daily after breakfast.  . Glycopyrrolate-Formoterol (BEVESPI AEROSPHERE) 9-4.8 MCG/ACT AERO Inhale 9 mcg into the lungs 2 (two) times daily.  Marland Kitchen IRON-B12-VIT C-FA-IFC PO Take 1 tablet by mouth daily. Unknown strength  . metoprolol tartrate (LOPRESSOR) 25 MG tablet Take 1 tablet (25 mg total) by mouth 2 (two) times daily.  . rosuvastatin (CRESTOR) 5 MG tablet Take 5 mg by mouth daily.  Marland Kitchen venlafaxine (EFFEXOR) 37.5 MG tablet Take 37.5 mg by mouth every evening.      Allergies:   Other and Erythromycin   Social  History   Socioeconomic History  . Marital status: Married    Spouse name: Not on file  . Number of children: Not on file  . Years of education: Not on file  . Highest education level: Not on file  Occupational History  . Not on file  Tobacco Use  . Smoking status: Former Smoker    Packs/day: 0.25    Types: Cigarettes    Quit date: 11/02/2016    Years since quitting: 3.4  . Smokeless tobacco: Never Used  Vaping Use  . Vaping Use: Never  used  Substance and Sexual Activity  . Alcohol use: No  . Drug use: No  . Sexual activity: Not on file  Other Topics Concern  . Not on file  Social History Narrative  . Not on file   Social Determinants of Health   Financial Resource Strain: Not on file  Food Insecurity: Not on file  Transportation Needs: Not on file  Physical Activity: Not on file  Stress: Not on file  Social Connections: Not on file     Family History: The patient's family history includes CAD in her father; Heart murmur in her sister; Hyperlipidemia in her brother, sister, and sister; Hypertension in her mother, sister, sister, and son; Lupus in her sister; Mitral valve prolapse in her brother; Stomach cancer in her mother. ROS:   Please see the history of present illness.    All 14 point review of systems negative except as described per history of present illness  EKGs/Labs/Other Studies Reviewed:      Recent Labs: No results found for requested labs within last 8760 hours.  Recent Lipid Panel    Component Value Date/Time   CHOL 208 (H) 05/07/2019 1125   TRIG 109 05/07/2019 1125   HDL 70 05/07/2019 1125   CHOLHDL 3.0 05/07/2019 1125   CHOLHDL 2.6 11/03/2016 0013   VLDL 8 11/03/2016 0013   LDLCALC 119 (H) 05/07/2019 1125    Physical Exam:    VS:  BP 136/72 (BP Location: Right Arm, Patient Position: Sitting)   Pulse 74   Ht 5' 1.75" (1.568 m)   Wt 152 lb (68.9 kg)   SpO2 93%   BMI 28.03 kg/m     Wt Readings from Last 3 Encounters:  04/16/20 152 lb (68.9 kg)  11/05/19 152 lb (68.9 kg)  05/04/19 151 lb (68.5 kg)     GEN:  Well nourished, well developed in no acute distress HEENT: Normal NECK: No JVD; No carotid bruits LYMPHATICS: No lymphadenopathy CARDIAC: RRR, no murmurs, no rubs, no gallops RESPIRATORY:  Clear to auscultation without rales, wheezing or rhonchi  ABDOMEN: Soft, non-tender, non-distended MUSCULOSKELETAL:  No edema; No deformity  SKIN: Warm and dry LOWER  EXTREMITIES: no swelling NEUROLOGIC:  Alert and oriented x 3 PSYCHIATRIC:  Normal affect   ASSESSMENT:    1. Paroxysmal atrial fibrillation (HCC)   2. Mixed hyperlipidemia   3. Atypical chest pain    PLAN:    In order of problems listed above:  1. Paroxysmal atrial fibrillation seems to maintain sinus rhythm.  Not anticoagulated but I reinitiated discussion about potential anticoagulation she is reluctant because she is anemic and she required some iron.  Apparently years ago she had colonoscopy gastroscopy done which was unrevealing of these she agreed to explore possibility of anticoagulation and will check her CBC, Chem-7 as well as stool for guaiac. 2. Dyslipidemia I did review her cholesterol which is still elevated however she is on 5 mg  Crestor will wait a few more months and then recheck the test. 3. Atypical chest pain denies having any. 4. COPD present stable   Medication Adjustments/Labs and Tests Ordered: Current medicines are reviewed at length with the patient today.  Concerns regarding medicines are outlined above.  No orders of the defined types were placed in this encounter.  Medication changes: No orders of the defined types were placed in this encounter.   Signed, Park Liter, MD, [facility] 04/16/2020 2:44 PM    [place]

## 2020-04-17 LAB — CBC
Hematocrit: 39.6 % (ref 34.0–46.6)
Hemoglobin: 13.1 g/dL (ref 11.1–15.9)
MCH: 29.4 pg (ref 26.6–33.0)
MCHC: 33.1 g/dL (ref 31.5–35.7)
MCV: 89 fL (ref 79–97)
Platelets: 326 10*3/uL (ref 150–450)
RBC: 4.45 x10E6/uL (ref 3.77–5.28)
RDW: 12.7 % (ref 11.7–15.4)
WBC: 6.8 10*3/uL (ref 3.4–10.8)

## 2020-04-17 LAB — BASIC METABOLIC PANEL
BUN/Creatinine Ratio: 27 (ref 12–28)
BUN: 23 mg/dL (ref 8–27)
CO2: 24 mmol/L (ref 20–29)
Calcium: 9.8 mg/dL (ref 8.7–10.3)
Chloride: 102 mmol/L (ref 96–106)
Creatinine, Ser: 0.84 mg/dL (ref 0.57–1.00)
Glucose: 90 mg/dL (ref 65–99)
Potassium: 4.8 mmol/L (ref 3.5–5.2)
Sodium: 142 mmol/L (ref 134–144)
eGFR: 72 mL/min/{1.73_m2} (ref >59)

## 2020-04-18 ENCOUNTER — Telehealth: Payer: Self-pay

## 2020-04-18 NOTE — Telephone Encounter (Signed)
-----   Message from Park Liter, MD sent at 04/17/2020  9:59 PM EDT ----- Chem-7 CBC looks good, no evidence of ischemia, awaiting stool for guaiac

## 2020-04-18 NOTE — Telephone Encounter (Signed)
Patient notified of results.

## 2020-04-29 ENCOUNTER — Telehealth: Payer: Self-pay | Admitting: Cardiology

## 2020-04-29 NOTE — Telephone Encounter (Signed)
New message:    Patient calling to to see if her stool results are back. Patient states if she is not home please leave message with husband.

## 2020-04-29 NOTE — Telephone Encounter (Signed)
Spoke to the patient just now and let her know that this result is not back yet but when it does result and Dr. Agustin Cree reviews it we will call her back with the results.    Encouraged patient to call back with any questions or concerns.

## 2020-05-07 LAB — FECAL OCCULT BLOOD, IMMUNOCHEMICAL: Fecal Occult Bld: NEGATIVE

## 2020-05-07 LAB — SPECIMEN STATUS REPORT

## 2020-07-04 DIAGNOSIS — Z13820 Encounter for screening for osteoporosis: Secondary | ICD-10-CM | POA: Insufficient documentation

## 2020-07-04 DIAGNOSIS — Z0000 Encounter for general adult medical examination without abnormal findings: Secondary | ICD-10-CM | POA: Insufficient documentation

## 2020-09-26 ENCOUNTER — Ambulatory Visit: Payer: Medicare HMO | Admitting: Cardiology

## 2020-11-12 ENCOUNTER — Other Ambulatory Visit: Payer: Self-pay

## 2020-11-12 ENCOUNTER — Ambulatory Visit: Payer: Medicare HMO | Admitting: Cardiology

## 2020-11-12 ENCOUNTER — Encounter: Payer: Self-pay | Admitting: Cardiology

## 2020-11-12 VITALS — BP 118/60 | HR 67 | Ht 61.75 in | Wt 151.0 lb

## 2020-11-12 DIAGNOSIS — J449 Chronic obstructive pulmonary disease, unspecified: Secondary | ICD-10-CM | POA: Diagnosis not present

## 2020-11-12 DIAGNOSIS — R0609 Other forms of dyspnea: Secondary | ICD-10-CM | POA: Diagnosis not present

## 2020-11-12 DIAGNOSIS — R0789 Other chest pain: Secondary | ICD-10-CM

## 2020-11-12 DIAGNOSIS — E782 Mixed hyperlipidemia: Secondary | ICD-10-CM

## 2020-11-12 DIAGNOSIS — I480 Paroxysmal atrial fibrillation: Secondary | ICD-10-CM | POA: Diagnosis not present

## 2020-11-12 NOTE — Progress Notes (Signed)
Cardiology Office Note:    Date:  11/12/2020   ID:  Jasmin Hughes, DOB 1945-11-17, MRN [id number]  PCP:  Cathlean Sauer, MD  Cardiologist:  Jenne Campus, MD    Referring MD: Cathlean Sauer, MD   Chief Complaint  Patient presents with   Follow-up  Doing well  History of Present Illness:    Jasmin Hughes is a 75 y.o. female with 1 documented episode of atrial fibrillation, essential hypertension, COPD, ex-smoker, dyslipidemia.  Comes today to my office for follow-up.  Overall she is doing very well denies have any palpitation except when she gets upset when she rushes somewhere.  There is no chest pain tightness squeezing pressure burning chest.  Also complained of having some shortness of breath while walking upstairs  Past Medical History:  Diagnosis Date   Acute on chronic respiratory failure with hypoxia ([place]) 11/03/2016   Anxiety 12/12/2013   Atrial fibrillation with RVR ([place]) 11/02/2016   Atypical chest pain 11/16/2018   Bipolar disorder ([place]) 12/12/2013   Cerebrovascular disease 09/23/2015   COPD exacerbation ([place]) 11/02/2016   COPD, moderate ([place]) 11/02/2016   Costochondritis 09/03/2013   Diverticulitis of colon 12/12/2013   Dyspnea on exertion 11/16/2018   Elevated alkaline phosphatase level 11/19/2014   Fall down stairs 12/31/2013   Fatty liver 12/22/2015   GERD (gastroesophageal reflux disease) 09/03/2013   Hiatal hernia 05/04/2012   Hyperlipemia 12/12/2013   Hypoxia    Iron deficiency anemia 03/04/2016   Iron deficiency anemia secondary to inadequate dietary iron intake 01/13/2016   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   Left flank pain 07/30/2013   Low back pain 09/03/2013   Mild cognitive impairment 10/20/2016   Osteoporosis 12/12/2013   Overweight 01/24/2017   Paroxysmal atrial fibrillation ([place]) 11/16/2018   Pharyngitis 11/22/2014   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   clobetasol cream (TEMOVATE) 6.60 % Apply 2 application topically 2 (two) times a week.   diltiazem (CARDIZEM CD) 120 MG 24 hr capsule Take 1 capsule (120 mg total) by mouth daily.   Ferrous Sulfate (IRON) 325 (65 Fe) MG TABS Take 1 tablet by mouth daily after breakfast.   IRON-B12-VIT C-FA-IFC PO Take 1 tablet by mouth daily. 325mg    metoprolol tartrate (LOPRESSOR) 25 MG tablet Take 1 tablet (25 mg total) by mouth 2 (two) times daily.   rosuvastatin (CRESTOR) 5 MG tablet Take 5 mg by mouth daily.   venlafaxine (EFFEXOR) 37.5 MG tablet Take 37.5 mg by mouth every evening.    [DISCONTINUED] Glycopyrrolate-Formoterol (BEVESPI AEROSPHERE) 9-4.8 MCG/ACT AERO Inhale 9 mcg into the lungs 2 (two) times daily.     Allergies:   Other and Erythromycin   Social History   Socioeconomic History   Marital status: Married    Spouse name: Not on file   Number of children: Not on file   Years of education: Not on file   Highest education level: Not on file  Occupational History   Not on file  Tobacco Use   Smoking status: Former    Packs/day: 0.25  Types: Cigarettes    Quit date: 11/02/2016    Years since quitting: 4.0   Smokeless tobacco: Never  Vaping Use   Vaping Use: Never used  Substance and Sexual Activity   Alcohol use: No   Drug use: No   Sexual activity: Not on file  Other Topics Concern   Not on file  Social History Narrative   Not on file   Social Determinants of Health   Financial Resource Strain: Not on file  Food Insecurity: Not on file  Transportation Needs: Not on file  Physical Activity: Not on file  Stress: Not on file  Social  Connections: Not on file     Family History: The patient's family history includes CAD in her father; Heart murmur in her sister; Hyperlipidemia in her brother, sister, and sister; Hypertension in her mother, sister, sister, and son; Lupus in her sister; Mitral valve prolapse in her brother; Stomach cancer in her mother. ROS:   Please see the history of present illness.    All 14 point review of systems negative except as described per history of present illness  EKGs/Labs/Other Studies Reviewed:      Recent Labs: 04/16/2020: BUN 23; Creatinine, Ser 0.84; Hemoglobin 13.1; Platelets 326; Potassium 4.8; Sodium 142  Recent Lipid Panel    Component Value Date/Time   CHOL 208 (H) 05/07/2019 1125   TRIG 109 05/07/2019 1125   HDL 70 05/07/2019 1125   CHOLHDL 3.0 05/07/2019 1125   CHOLHDL 2.6 11/03/2016 0013   VLDL 8 11/03/2016 0013   LDLCALC 119 (H) 05/07/2019 1125    Physical Exam:    VS:  BP 118/60 (BP Location: Right Arm, Patient Position: Sitting)   Pulse 67   Ht 5' 1.75" (1.568 m)   Wt 151 lb (68.5 kg)   SpO2 91%   BMI 27.84 kg/m     Wt Readings from Last 3 Encounters:  11/12/20 151 lb (68.5 kg)  04/16/20 152 lb (68.9 kg)  11/05/19 152 lb (68.9 kg)     GEN:  Well nourished, well developed in no acute distress HEENT: Normal NECK: No JVD; No carotid bruits LYMPHATICS: No lymphadenopathy CARDIAC: RRR, no murmurs, no rubs, no gallops RESPIRATORY:  Clear to auscultation without rales, wheezing or rhonchi  ABDOMEN: Soft, non-tender, non-distended MUSCULOSKELETAL:  No edema; No deformity  SKIN: Warm and dry LOWER EXTREMITIES: no swelling NEUROLOGIC:  Alert and oriented x 3 PSYCHIATRIC:  Normal affect   ASSESSMENT:    1. Paroxysmal atrial fibrillation (HCC)   2. COPD, moderate ([place])   3. Dyspnea on exertion   4. Atypical chest pain   5. Mixed hyperlipidemia    PLAN:    In order of problems listed above:  Paroxysmal atrial fibrillation: Refuses anticoagulation.   Her CHADS2 Vascor equals 4.  In spite of that she does not want to be anticoagulated.  Multiple excuses she is getting me likely she denies have any palpitation to suggest recurrences of atrial fibrillation. COPD: Stable, likely she quit smoking.  Continue monitoring. Atypical chest pain denies having any. Cholesterol profile I did review her cholesterol from 4 months ago done by primary care physician LDL of 66 HDL of 62 good cholesterol profile she is on Crestor 5 which I will continue.   Medication Adjustments/Labs and Tests Ordered: Current medicines are reviewed at length with the patient today.  Concerns regarding medicines are outlined above.  No orders of the defined types were placed in this encounter.  Medication changes: No orders of  the defined types were placed in this encounter.   Signed, Park Liter, MD, [facility] 11/12/2020 1:22 PM    [place]

## 2020-11-12 NOTE — Patient Instructions (Signed)

## 2020-12-03 ENCOUNTER — Emergency Department (HOSPITAL_BASED_OUTPATIENT_CLINIC_OR_DEPARTMENT_OTHER)
Admission: EM | Admit: 2020-12-03 | Discharge: 2020-12-04 | Disposition: A | Discharge status: Home / Self Care | Payer: Medicare HMO | Attending: Emergency Medicine | Admitting: Emergency Medicine

## 2020-12-03 ENCOUNTER — Other Ambulatory Visit: Payer: Self-pay

## 2020-12-03 ENCOUNTER — Encounter (HOSPITAL_BASED_OUTPATIENT_CLINIC_OR_DEPARTMENT_OTHER): Payer: Self-pay | Admitting: *Deleted

## 2020-12-03 DIAGNOSIS — Z79899 Other long term (current) drug therapy: Secondary | ICD-10-CM | POA: Diagnosis not present

## 2020-12-03 DIAGNOSIS — S0990XA Unspecified injury of head, initial encounter: Secondary | ICD-10-CM | POA: Diagnosis present

## 2020-12-03 DIAGNOSIS — W19XXXA Unspecified fall, initial encounter: Secondary | ICD-10-CM

## 2020-12-03 DIAGNOSIS — W01198A Fall on same level from slipping, tripping and stumbling with subsequent striking against other object, initial encounter: Secondary | ICD-10-CM | POA: Insufficient documentation

## 2020-12-03 DIAGNOSIS — S0101XA Laceration without foreign body of scalp, initial encounter: Secondary | ICD-10-CM

## 2020-12-03 DIAGNOSIS — J449 Chronic obstructive pulmonary disease, unspecified: Secondary | ICD-10-CM | POA: Diagnosis not present

## 2020-12-03 DIAGNOSIS — Z87891 Personal history of nicotine dependence: Secondary | ICD-10-CM | POA: Insufficient documentation

## 2020-12-03 MED ORDER — LIDOCAINE HCL (PF) 1 % IJ SOLN
5.0000 mL | Freq: Once | INTRAMUSCULAR | Status: AC
  Administered 2020-12-03: 5 mL via INTRADERMAL
  Filled 2020-12-03: qty 5

## 2020-12-03 NOTE — Discharge Instructions (Addendum)
Staples are to be removed in 5 days.  Please follow-up with your primary doctor for this.  It is okay to shower and wash your hair.  Return to the emergency department if you develop severe headache, dizziness, nausea/vomiting, weakness, numbness, difficulty waking, seizures or convulsions, or other new and concerning symptoms.

## 2020-12-03 NOTE — ED Provider Notes (Signed)
[place] [facility] [facility] Provider Note   CSN: [id number] Arrival date & time: 12/03/20  2244     History Chief Complaint  Patient presents with   Jasmin Hughes is a 75 y.o. female.  Patient is a 75 year old female with past medical history of COPD, atrial fibrillation, anxiety.  Patient presenting today with complaints of head injury.  Patient was knocked over by her son's dog.  Patient fell to the ground and believes she hit her head on the corner of the closet door.  She denies any loss of consciousness, headache, neck pain, but does have a laceration to the left occipital region.  She denies any visual disturbances, dizziness, or nausea.  The history is provided by the patient.      Past Medical History:  Diagnosis Date   Acute on chronic respiratory failure with hypoxia ([place]) 11/03/2016   Anxiety 12/12/2013   Atrial fibrillation with RVR ([place]) 11/02/2016   Atypical chest pain 11/16/2018   Bipolar disorder ([place]) 12/12/2013   Cerebrovascular disease 09/23/2015   COPD exacerbation ([place]) 11/02/2016   COPD, moderate ([place]) 11/02/2016   Costochondritis 09/03/2013   Diverticulitis of colon 12/12/2013   Dyspnea on exertion 11/16/2018   Elevated alkaline phosphatase level 11/19/2014   Fall down stairs 12/31/2013   Fatty liver 12/22/2015   GERD (gastroesophageal reflux disease) 09/03/2013   Hiatal hernia 05/04/2012   Hyperlipemia 12/12/2013   Hypoxia    Iron deficiency anemia 03/04/2016   Iron deficiency anemia secondary to inadequate dietary iron intake 01/13/2016   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   Left flank pain 07/30/2013   Low back pain 09/03/2013   Mild cognitive impairment 10/20/2016   Osteoporosis 12/12/2013   Overweight 01/24/2017   Paroxysmal atrial fibrillation ([place]) 11/16/2018   Pharyngitis 11/22/2014   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   Pseudophakia of both eyes 04/12/2017   Renal insufficiency 11/22/2014   Spasm of  cervical paraspinous muscle 06/16/2015    Patient Active Problem List   Diagnosis Date Noted   Atypical chest pain 11/16/2018   Dyspnea on exertion 11/16/2018   Pseudophakia of both eyes 04/12/2017   Overweight 01/24/2017   Hypoxia    Acute on chronic respiratory failure with hypoxia ([place]) 11/03/2016   COPD, moderate ([place]) 11/02/2016   Atrial fibrillation with RVR ([place]) 11/02/2016   Paroxysmal atrial fibrillation ([place]) 11/02/2016   Mild cognitive impairment 10/20/2016   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   Iron deficiency anemia 03/04/2016   Iron deficiency anemia secondary to inadequate dietary iron intake 01/13/2016   Fatty liver 12/22/2015   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   Cerebrovascular disease 09/23/2015   Spasm of cervical paraspinous muscle 06/16/2015   Pharyngitis 11/22/2014   Renal insufficiency 11/22/2014   Elevated alkaline phosphatase level 11/19/2014   Fall down stairs 12/31/2013   Anxiety 12/12/2013   Bipolar disorder ([place]) 12/12/2013   Diverticulitis of colon 12/12/2013   Hyperlipemia 12/12/2013   Osteoporosis 12/12/2013   Costochondritis 09/03/2013   GERD (gastroesophageal reflux disease) 09/03/2013   Low back pain 09/03/2013   Left flank pain 07/30/2013   Hiatal hernia 05/04/2012    Past Surgical History:  Procedure Laterality Date   HIATAL HERNIA REPAIR     PARTIAL HYSTERECTOMY       OB History   No obstetric history on file.     Family History  Problem Relation Age of Onset   Stomach cancer Mother    Hypertension Mother    CAD  Father        MI, CABG in his 9's   Lupus Sister    Hypertension Sister    Hyperlipidemia Sister    Hyperlipidemia Brother    Mitral valve prolapse Brother    Hyperlipidemia Sister    Hypertension Sister    Heart murmur Sister    Hypertension Son     Social History   Tobacco Use   Smoking status: Former    Packs/day: 0.25    Types: Cigarettes    Quit date: 11/02/2016    Years  since quitting: 4.0   Smokeless tobacco: Never  Vaping Use   Vaping Use: Never used  Substance Use Topics   Alcohol use: No   Drug use: No    Home Medications Prior to Admission medications   Medication Sig Start Date End Date Taking? Authorizing Provider  albuterol (VENTOLIN HFA) 108 (90 Base) MCG/ACT inhaler Inhale 2 puffs into the lungs every 6 (six) hours as needed for wheezing. 10/01/19   [provider]  clobetasol cream (TEMOVATE) 1.61 % Apply 2 application topically 2 (two) times a week. 09/24/19   [provider]  diltiazem (CARDIZEM CD) 120 MG 24 hr capsule Take 1 capsule (120 mg total) by mouth daily. 11/05/16   Charlynne Cousins, MD  Ferrous Sulfate (IRON) 325 (65 Fe) MG TABS Take 1 tablet by mouth daily after breakfast.    [provider]  IRON-B12-VIT C-FA-IFC PO Take 1 tablet by mouth daily. 325mg     [provider]  metoprolol tartrate (LOPRESSOR) 25 MG tablet Take 1 tablet (25 mg total) by mouth 2 (two) times daily. 11/05/16   Charlynne Cousins, MD  rosuvastatin (CRESTOR) 5 MG tablet Take 5 mg by mouth daily. 03/22/20   [provider]  venlafaxine (EFFEXOR) 37.5 MG tablet Take 37.5 mg by mouth every evening.  07/29/15   [provider]    Allergies    Other and Erythromycin  Review of Systems   Review of Systems  All other systems reviewed and are negative.  Physical Exam Updated Vital Signs BP (!) 176/90 (BP Location: Right Arm)   Pulse 64   Temp 98.6 F (37 C) (Oral)   Resp 20   Ht 5' 1.75" (1.568 m)   Wt 68.9 kg   SpO2 96%   BMI 28.03 kg/m   Physical Exam Vitals and nursing note reviewed.  Constitutional:      General: She is not in acute distress.    Appearance: She is well-developed. She is not diaphoretic.  HENT:     Head: Normocephalic and atraumatic.  Cardiovascular:     Rate and Rhythm: Normal rate and regular rhythm.     Heart sounds: No murmur heard.   No friction rub. No gallop.   Pulmonary:     Effort: Pulmonary effort is normal. No respiratory distress.     Breath sounds: Normal breath sounds. No wheezing.  Abdominal:     General: Bowel sounds are normal. There is no distension.     Palpations: Abdomen is soft.     Tenderness: There is no abdominal tenderness.  Musculoskeletal:        General: Normal range of motion.     Cervical back: Normal range of motion and neck supple.  Skin:    General: Skin is warm and dry.  Neurological:     General: No focal deficit present.     Mental Status: She is alert and oriented to person, place, and  time. Mental status is at baseline.     Cranial Nerves: No cranial nerve deficit.     Sensory: No sensory deficit.     Motor: No weakness.     Coordination: Coordination normal.     Gait: Gait normal.    ED Results / Procedures / Treatments   Labs (all labs ordered are listed, but only abnormal results are displayed) Labs Reviewed - No data to display  EKG None  Radiology No results found.  Procedures Procedures   Medications Ordered in ED Medications  lidocaine (PF) (XYLOCAINE) 1 % injection 5 mL (5 mLs Intradermal Given by Other 12/03/20 2350)    ED Course  I have reviewed the triage vital signs and the nursing notes.  Pertinent labs & imaging results that were available during my care of the patient were reviewed by me and considered in my medical decision making (see chart for details).    MDM Rules/Calculators/A&P  Patient presenting with fall and scalp laceration as described in the HPI.  There was no loss of consciousness and she denies any headache or other neurologic symptoms.  The laceration was repaired as below.  Discussion as to whether or not to perform imaging studies resulted in mutual agreement that we could forego this.  She is not taking any blood thinners, has no headache or other symptoms.  Return precautions given.  Patient seems appropriate for discharge with as needed  return.  LACERATION REPAIR Performed by: Veryl Speak Authorized by: Veryl Speak Consent: Verbal consent obtained. Risks and benefits: risks, benefits and alternatives were discussed Consent given by: patient Patient identity confirmed: provided demographic data Prepped and Draped in normal sterile fashion Wound explored  Laceration Location: Left occipital  Laceration Length: 2.5 cm  No Foreign Bodies seen or palpated  Anesthesia: local infiltration  Local anesthetic: lidocaine 1% without epinephrine  Anesthetic total: 2 ml  Irrigation method: syringe Amount of cleaning: standard  Skin closure: Staples  Number of sutures: 2  Technique: Staples  Patient tolerance: Patient tolerated the procedure well with no immediate complications.   Final Clinical Impression(s) / ED Diagnoses Final diagnoses:  None    Rx / DC Orders ED Discharge Orders     None        Veryl Speak, MD 12/03/20 2354

## 2020-12-03 NOTE — ED Triage Notes (Signed)
C/o mechanical fall x 1 hr ago hitting head on wall with lac , denies LOC

## 2021-06-02 ENCOUNTER — Ambulatory Visit: Payer: Medicare HMO | Admitting: Cardiology

## 2021-09-15 ENCOUNTER — Ambulatory Visit: Payer: Medicare HMO | Admitting: Cardiology

## 2021-11-06 DIAGNOSIS — F4320 Adjustment disorder, unspecified: Secondary | ICD-10-CM | POA: Insufficient documentation

## 2021-12-28 DIAGNOSIS — L301 Dyshidrosis [pompholyx]: Secondary | ICD-10-CM | POA: Insufficient documentation

## 2021-12-28 DIAGNOSIS — F32A Depression, unspecified: Secondary | ICD-10-CM | POA: Insufficient documentation

## 2022-03-04 ENCOUNTER — Emergency Department (HOSPITAL_BASED_OUTPATIENT_CLINIC_OR_DEPARTMENT_OTHER): Payer: Medicare HMO

## 2022-03-04 ENCOUNTER — Encounter (HOSPITAL_BASED_OUTPATIENT_CLINIC_OR_DEPARTMENT_OTHER): Payer: Self-pay | Admitting: Urology

## 2022-03-04 ENCOUNTER — Other Ambulatory Visit: Payer: Self-pay

## 2022-03-04 ENCOUNTER — Emergency Department (HOSPITAL_BASED_OUTPATIENT_CLINIC_OR_DEPARTMENT_OTHER)
Admission: EM | Admit: 2022-03-04 | Discharge: 2022-03-04 | Disposition: A | Discharge status: Home / Self Care | Payer: Medicare HMO | Attending: Emergency Medicine | Admitting: Emergency Medicine

## 2022-03-04 DIAGNOSIS — S0003XA Contusion of scalp, initial encounter: Secondary | ICD-10-CM | POA: Diagnosis not present

## 2022-03-04 DIAGNOSIS — W19XXXA Unspecified fall, initial encounter: Secondary | ICD-10-CM

## 2022-03-04 DIAGNOSIS — R030 Elevated blood-pressure reading, without diagnosis of hypertension: Secondary | ICD-10-CM

## 2022-03-04 DIAGNOSIS — I10 Essential (primary) hypertension: Secondary | ICD-10-CM | POA: Diagnosis not present

## 2022-03-04 DIAGNOSIS — Z79899 Other long term (current) drug therapy: Secondary | ICD-10-CM | POA: Diagnosis not present

## 2022-03-04 DIAGNOSIS — W0110XA Fall on same level from slipping, tripping and stumbling with subsequent striking against unspecified object, initial encounter: Secondary | ICD-10-CM | POA: Insufficient documentation

## 2022-03-04 DIAGNOSIS — Y92009 Unspecified place in unspecified non-institutional (private) residence as the place of occurrence of the external cause: Secondary | ICD-10-CM | POA: Diagnosis not present

## 2022-03-04 LAB — BASIC METABOLIC PANEL
Anion gap: 5 (ref 5–15)
BUN: 18 mg/dL (ref 8–23)
CO2: 29 mmol/L (ref 22–32)
Calcium: 8.9 mg/dL (ref 8.9–10.3)
Chloride: 102 mmol/L (ref 98–111)
Creatinine, Ser: 0.8 mg/dL (ref 0.44–1.00)
GFR, Estimated: >60 mL/min (ref >60)
Glucose, Bld: 101 mg/dL — ABNORMAL HIGH (ref 70–99)
Potassium: 3.7 mmol/L (ref 3.5–5.1)
Sodium: 136 mmol/L (ref 135–145)

## 2022-03-04 LAB — CBC
HCT: 39 % (ref 36.0–46.0)
Hemoglobin: 12.9 g/dL (ref 12.0–15.0)
MCH: 29.8 pg (ref 26.0–34.0)
MCHC: 33.1 g/dL (ref 30.0–36.0)
MCV: 90.1 fL (ref 80.0–100.0)
Platelets: 265 10*3/uL (ref 150–400)
RBC: 4.33 MIL/uL (ref 3.87–5.11)
RDW: 12.1 % (ref 11.5–15.5)
WBC: 9.4 10*3/uL (ref 4.0–10.5)
nRBC: 0 % (ref 0.0–0.2)

## 2022-03-04 MED ORDER — ACETAMINOPHEN 500 MG PO TABS
1000.0000 mg | ORAL_TABLET | Freq: Once | ORAL | Status: AC
Start: 2022-03-04 — End: 2022-03-04
  Administered 2022-03-04: 1000 mg via ORAL
  Filled 2022-03-04: qty 2

## 2022-03-04 NOTE — ED Provider Notes (Signed)
[facility] Provider Note   CSN: [id number] Arrival date & time: 03/04/22  1705     History  Chief Complaint  Patient presents with   Jasmin Hughes is a 77 y.o. female.  Pt s/p fall at home. Pt not sure what caused fall, was holding her dog, and trying to lock door, when fell back and hit head. ?brief loc. Large contusion to posterior scalp. No anticoagulant use. Denies neck or back pain. No radicular pain. Dull headache, mild. No preceding headaches. States felt fine, at baseline prior to fall - no faintness or dizziness, no chest pain or sob, no palpitations. Denies extremity pain or injury post fall. No chest pain. No sob. No abd pain or nv. No dysuria or gu c/o. No recent blood loss or melena/rectal bleeding. No recent change in meds. Has been eating/drinking per normal.   The history is provided by the patient, medical records and a friend.  Fall Pertinent negatives include no chest pain, no abdominal pain and no shortness of breath.       Home Medications Prior to Admission medications   Medication Sig Start Date End Date Taking? Authorizing Provider  ALPRAZolam Duanne Moron) 0.25 MG tablet Take 1/2 to 1 full tab daily as needed for anxiety 12/28/21  Yes [provider]  albuterol (VENTOLIN HFA) 108 (90 Base) MCG/ACT inhaler Inhale 2 puffs into the lungs every 6 (six) hours as needed for wheezing. 10/01/19   [provider]  clobetasol cream (TEMOVATE) AB-123456789 % Apply 2 application topically 2 (two) times a week. 09/24/19   [provider]  diltiazem (CARDIZEM CD) 120 MG 24 hr capsule Take 1 capsule (120 mg total) by mouth daily. 11/05/16   Charlynne Cousins, MD  Ferrous Sulfate (IRON) 325 (65 Fe) MG TABS Take 1 tablet by mouth daily after breakfast.    [provider]  IRON-B12-VIT C-FA-IFC PO Take 1 tablet by mouth daily. 352m    [provider]  metoprolol tartrate (LOPRESSOR)  25 MG tablet Take 1 tablet (25 mg total) by mouth 2 (two) times daily. 11/05/16   FCharlynne Cousins MD  rosuvastatin (CRESTOR) 5 MG tablet Take 5 mg by mouth daily. 03/22/20   [provider]  venlafaxine (EFFEXOR) 37.5 MG tablet Take 37.5 mg by mouth every evening.  07/29/15   [provider]      Allergies    Other and Erythromycin    Review of Systems   Review of Systems  Constitutional:  Negative for chills and fever.  HENT:  Negative for nosebleeds.   Eyes:  Negative for visual disturbance.  Respiratory:  Negative for shortness of breath.   Cardiovascular:  Negative for chest pain, palpitations and leg swelling.  Gastrointestinal:  Negative for abdominal pain, blood in stool, nausea and vomiting.  Genitourinary:  Negative for dysuria and flank pain.  Musculoskeletal:  Negative for back pain and neck pain.  Skin:  Negative for wound.  Neurological:  Negative for weakness and numbness.  Hematological:  Does not bruise/bleed easily.  Psychiatric/Behavioral:  Negative for confusion.     Physical Exam Updated Vital Signs BP (!) 163/86   Pulse 72   Temp 98.2 F (36.8 C) (Oral)   Resp 18   Ht 1.575 m (5' 2"$ )   Wt 69.4 kg   SpO2 96%   BMI 27.98 kg/m  Physical Exam Vitals and nursing note reviewed.  Constitutional:  Appearance: Normal appearance. She is well-developed.  HENT:     Head:     Comments: Large contusion posterior scalp.     Nose: Nose normal.     Mouth/Throat:     Mouth: Mucous membranes are moist.  Eyes:     General: No scleral icterus.    Conjunctiva/sclera: Conjunctivae normal.     Pupils: Pupils are equal, round, and reactive to light.  Neck:     Vascular: No carotid bruit.     Trachea: No tracheal deviation.  Cardiovascular:     Rate and Rhythm: Normal rate and regular rhythm.     Pulses: Normal pulses.     Heart sounds: Normal heart sounds. No murmur heard.    No friction rub. No gallop.  Pulmonary:     Effort: Pulmonary  effort is normal. No respiratory distress.     Breath sounds: Normal breath sounds.  Chest:     Chest wall: No tenderness.  Abdominal:     General: There is no distension.     Palpations: Abdomen is soft. There is no mass.     Tenderness: There is no abdominal tenderness.  Genitourinary:    Comments: No cva tenderness.  Musculoskeletal:        General: No swelling.     Cervical back: Normal range of motion and neck supple. No rigidity or tenderness. No muscular tenderness.     Comments: CTLS spine, non tender, aligned, no step off. Good rom bil extremities without pain or focal bony tenderness.   Skin:    General: Skin is warm and dry.     Findings: No rash.  Neurological:     Mental Status: She is alert.     Comments: Alert, speech normal. Motor/sens grossly intact bil. Steady gait. Gcs 15.   Psychiatric:        Mood and Affect: Mood normal.     ED Results / Procedures / Treatments   Labs (all labs ordered are listed, but only abnormal results are displayed) Results for orders placed or performed during the hospital encounter of [id number]  Basic metabolic panel  Result Value Ref Range   Sodium 136 135 - 145 mmol/L   Potassium 3.7 3.5 - 5.1 mmol/L   Chloride 102 98 - 111 mmol/L   CO2 29 22 - 32 mmol/L   Glucose, Bld 101 (H) 70 - 99 mg/dL   BUN 18 8 - 23 mg/dL   Creatinine, Ser 0.80 0.44 - 1.00 mg/dL   Calcium 8.9 8.9 - 10.3 mg/dL   GFR, Estimated >60 >60 mL/min   Anion gap 5 5 - 15  CBC  Result Value Ref Range   WBC 9.4 4.0 - 10.5 K/uL   RBC 4.33 3.87 - 5.11 MIL/uL   Hemoglobin 12.9 12.0 - 15.0 g/dL   HCT 39.0 36.0 - 46.0 %   MCV 90.1 80.0 - 100.0 fL   MCH 29.8 26.0 - 34.0 pg   MCHC 33.1 30.0 - 36.0 g/dL   RDW 12.1 11.5 - 15.5 %   Platelets 265 150 - 400 K/uL   nRBC 0.0 0.0 - 0.2 %   CT Head Wo Contrast  Result Date: 03/04/2022 CLINICAL DATA:  [place] backwards, hit head, scalp hematoma EXAM: CT HEAD WITHOUT CONTRAST TECHNIQUE: Contiguous axial images were  obtained from the base of the skull through the vertex without intravenous contrast. RADIATION DOSE REDUCTION: This exam was performed according to the departmental dose-optimization program which includes automated exposure control, adjustment of  the mA and/or kV according to patient size and/or use of iterative reconstruction technique. COMPARISON:  None Available. FINDINGS: Brain: No acute infarct or hemorrhage. Hypodensities within the periventricular white matter and bilateral basal ganglia consistent with chronic small vessel ischemic changes. Lateral ventricles and remaining midline structures are unremarkable. No acute extra-axial fluid collections. No mass effect. Vascular: Vascular calcifications throughout the internal carotid arteries. No hyperdense vessel. Skull: Large scalp hematoma in the right parietooccipital region. No underlying fracture. The remainder of the calvarium is unremarkable. Sinuses/Orbits: No acute finding. Other: None. IMPRESSION: 1. Large right parietooccipital scalp hematoma. No underlying fracture. 2. No acute intracranial process. 3. Chronic small-vessel ischemic changes as above. Electronically Signed   By: Randa Ngo M.D.   On: 03/04/2022 18:50    ED ECG REPORT   Date: 03/04/2022  Rate: 73  Rhythm: normal sinus rhythm  QRS Axis: normal  Intervals: normal  ST/T Wave abnormalities: normal  Conduction Disutrbances:none  Narrative Interpretation:   Old EKG Reviewed: none available  I have personally reviewed the EKG tracing   Radiology CT Head Wo Contrast  Result Date: 03/04/2022 CLINICAL DATA:  [place] backwards, hit head, scalp hematoma EXAM: CT HEAD WITHOUT CONTRAST TECHNIQUE: Contiguous axial images were obtained from the base of the skull through the vertex without intravenous contrast. RADIATION DOSE REDUCTION: This exam was performed according to the departmental dose-optimization program which includes automated exposure control, adjustment of the mA  and/or kV according to patient size and/or use of iterative reconstruction technique. COMPARISON:  None Available. FINDINGS: Brain: No acute infarct or hemorrhage. Hypodensities within the periventricular white matter and bilateral basal ganglia consistent with chronic small vessel ischemic changes. Lateral ventricles and remaining midline structures are unremarkable. No acute extra-axial fluid collections. No mass effect. Vascular: Vascular calcifications throughout the internal carotid arteries. No hyperdense vessel. Skull: Large scalp hematoma in the right parietooccipital region. No underlying fracture. The remainder of the calvarium is unremarkable. Sinuses/Orbits: No acute finding. Other: None. IMPRESSION: 1. Large right parietooccipital scalp hematoma. No underlying fracture. 2. No acute intracranial process. 3. Chronic small-vessel ischemic changes as above. Electronically Signed   By: Randa Ngo M.D.   On: 03/04/2022 18:50    Procedures Procedures    Medications Ordered in ED Medications  acetaminophen (TYLENOL) tablet 1,000 mg (1,000 mg Oral Given 03/04/22 1949)    ED Course/ Medical Decision Making/ A&P                             Medical Decision Making Problems Addressed: Accidental fall, initial encounter: acute illness or injury with systemic symptoms that poses a threat to life or bodily functions Contusion of scalp, initial encounter: acute illness or injury with systemic symptoms that poses a threat to life or bodily functions Elevated blood pressure reading: acute illness or injury Essential hypertension: chronic illness or injury with exacerbation, progression, or side effects of treatment that poses a threat to life or bodily functions  Amount and/or Complexity of Data Reviewed Independent Historian: friend External Data Reviewed: notes. Labs: ordered. Decision-making details documented in ED Course. Radiology: ordered and independent interpretation performed.  Decision-making details documented in ED Course. ECG/medicine tests: ordered and independent interpretation performed. Decision-making details documented in ED Course.  Risk OTC drugs. Decision regarding hospitalization.   Iv ns. Continuous pulse ox and cardiac monitoring. Labs ordered/sent. Imaging ordered.   Differential diagnosis includes head injury, sdh, syncope, anemia, dehydration, etc . Dispo decision including potential  need for admission considered - will get labs and imaging and reassess.   Reviewed nursing notes and prior charts for additional history. External reports reviewed. Additional history from: friend.   Cardiac monitor: sinus rhythm, rate 78.  Labs reviewed/interpreted by me - wbc and hgb normal.   CT reviewed/interpreted by me - no hem.   Acetaminophen po. Po fluids.   Rec close pcp f/u.  Return precautions provided.          Final Clinical Impression(s) / ED Diagnoses Final diagnoses:  None    Rx / DC Orders ED Discharge Orders     None         Lajean Saver, MD 03/04/22 2032

## 2022-03-04 NOTE — ED Triage Notes (Signed)
Pt states mechanical fall backwards on the sidewalk and hit her head (occipital)  Large bump noted but no laceration   No Blood thinners

## 2022-03-04 NOTE — Discharge Instructions (Addendum)
It was our pleasure to provide your ER care today - we hope that you feel better.  Icepack/cold to sore area. Take acetaminophen as need. Fall precautions.   Follow up closely with primary care doctor in the next 1-2 weeks - also have your blood pressure rechecked then as it is high today.  Return to ER if worse, new symptoms, new/severe pain, persistent vomiting, weak/fainting, chest pain, trouble breathing, or other emergency concern.

## 2022-04-08 ENCOUNTER — Ambulatory Visit: Payer: Medicare HMO | Attending: Cardiology

## 2022-04-08 ENCOUNTER — Encounter: Payer: Self-pay | Admitting: Cardiology

## 2022-04-08 ENCOUNTER — Ambulatory Visit: Payer: Medicare HMO | Attending: Cardiology | Admitting: Cardiology

## 2022-04-08 VITALS — BP 124/74 | HR 67 | Ht 59.5 in | Wt 153.0 lb

## 2022-04-08 DIAGNOSIS — J449 Chronic obstructive pulmonary disease, unspecified: Secondary | ICD-10-CM

## 2022-04-08 DIAGNOSIS — I480 Paroxysmal atrial fibrillation: Secondary | ICD-10-CM

## 2022-04-08 DIAGNOSIS — R0609 Other forms of dyspnea: Secondary | ICD-10-CM | POA: Diagnosis not present

## 2022-04-08 DIAGNOSIS — R0789 Other chest pain: Secondary | ICD-10-CM

## 2022-04-08 MED ORDER — APIXABAN 5 MG PO TABS: 5.0000 mg | ORAL_TABLET | Freq: Two times a day (BID) | ORAL | 6 refills | Status: DC

## 2022-04-08 NOTE — Patient Instructions (Signed)
Medication Instructions:   START: Eliquis 5mg  1 tablet twice daily   Lab Work: 3rd floor [address] Stool for blood- today If you have labs (blood work) drawn today and your tests are completely normal, you will receive your results only by: [employer] (if you have MyChart) OR A paper copy in the mail If you have any lab test that is abnormal or we need to change your treatment, we will call you to review the results.   Testing/Procedures: Your physician has requested that you have an echocardiogram. Echocardiography is a painless test that uses sound waves to create images of your heart. It provides your doctor with information about the size and shape of your heart and how well your heart's chambers and valves are working. This procedure takes approximately one hour. There are no restrictions for this procedure. Please do NOT wear cologne, perfume, aftershave, or lotions (deodorant is allowed). Please arrive 15 minutes prior to your appointment time.    WHY IS MY DOCTOR PRESCRIBING ZIO? The Zio system is proven and trusted by physicians to detect and diagnose irregular heart rhythms -- and has been prescribed to hundreds of thousands of patients.  The FDA has cleared the Zio system to monitor for many different kinds of irregular heart rhythms. In a study, physicians were able to reach a diagnosis 90% of the time with the Zio system1.  You can wear the Zio monitor -- a small, discreet, comfortable patch -- during your normal day-to-day activity, including while you sleep, shower, and exercise, while it records every single heartbeat for analysis.  1Barrett, P., et al. Comparison of 24 Hour Holter Monitoring Versus 14 Day Novel Adhesive Patch Electrocardiographic Monitoring. [place], 2014.  ZIO VS. HOLTER MONITORING The Zio monitor can be comfortably worn for up to 14 days. Holter monitors can be worn for 24 to 48 hours, limiting the time to record any  irregular heart rhythms you may have. Zio is able to capture data for the 51% of patients who have their first symptom-triggered arrhythmia after 48 hours.1  LIVE WITHOUT RESTRICTIONS The Zio ambulatory cardiac monitor is a small, unobtrusive, and water-resistant patch--you might even forget you're wearing it. The Zio monitor records and stores every beat of your heart, whether you're sleeping, working out, or showering.    Follow-Up: At [facility], you and your health needs are our priority.  As part of our continuing mission to provide you with exceptional heart care, we have created designated Provider Care Teams.  These Care Teams include your primary Cardiologist (physician) and Advanced Practice Providers (APPs -  Physician Assistants and Nurse Practitioners) who all work together to provide you with the care you need, when you need it.  We recommend signing up for the patient portal called "MyChart".  Sign up information is provided on this After Visit Summary.  MyChart is used to connect with patients for Virtual Visits (Telemedicine).  Patients are able to view lab/test results, encounter notes, upcoming appointments, etc.  Non-urgent messages can be sent to your provider as well.   To learn more about what you can do with MyChart, go to [email or web address].    Your next appointment:   3 month(s)  The format for your next appointment:   In Person  Provider:   Jenne Campus, MD    Other Instructions NA

## 2022-04-08 NOTE — Progress Notes (Signed)
Cardiology Office Note:    Date:  04/08/2022   ID:  Jasmin Hughes, DOB 1945-09-23, MRN [id number]  PCP:  Cathlean Sauer, MD  Cardiologist:  Jenne Campus, MD    Referring MD: Cathlean Sauer, MD   No chief complaint on file. More palpitations  History of Present Illness:    Jasmin Hughes is a 77 y.o. female past medical history significant for COPD, paroxysmal atrial fibrillation, atypical chest pain.  She comes today to months for follow-up.  She described it more palpitations she said about every month she will have spurts of heart will speed up.  There is no chest pain tightness squeezing pressure burning chest.  Previously she refused anticoagulation she is open for possible anticoagulation.  Will make me worried however is the fact that she is clumsy and she fell down 1 time and struck her head.  Past Medical History:  Diagnosis Date   Acute on chronic respiratory failure with hypoxia ([place]) 11/03/2016   Anxiety 12/12/2013   Atrial fibrillation with RVR ([place]) 11/02/2016   Atypical chest pain 11/16/2018   Bipolar disorder ([place]) 12/12/2013   Cerebrovascular disease 09/23/2015   COPD exacerbation ([place]) 11/02/2016   COPD, moderate ([place]) 11/02/2016   Costochondritis 09/03/2013   Diverticulitis of colon 12/12/2013   Dyspnea on exertion 11/16/2018   Elevated alkaline phosphatase level 11/19/2014   Fall down stairs 12/31/2013   Fatty liver 12/22/2015   GERD (gastroesophageal reflux disease) 09/03/2013   Hiatal hernia 05/04/2012   Hyperlipemia 12/12/2013   Hypoxia    Iron deficiency anemia 03/04/2016   Iron deficiency anemia secondary to inadequate dietary iron intake 01/13/2016   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   Left flank pain 07/30/2013   Low back pain 09/03/2013   Mild cognitive impairment 10/20/2016   Osteoporosis 12/12/2013   Overweight 01/24/2017   Paroxysmal atrial fibrillation ([place]) 11/16/2018   Pharyngitis 11/22/2014   Primary osteoarthritis involving multiple joints  12/22/2015   Pseudophakia of both eyes 04/12/2017   Renal insufficiency 11/22/2014   Spasm of cervical paraspinous muscle 06/16/2015    Past Surgical History:  Procedure Laterality Date   HIATAL HERNIA REPAIR     PARTIAL HYSTERECTOMY      Current Medications: Current Meds  Medication Sig   albuterol (VENTOLIN HFA) 108 (90 Base) MCG/ACT inhaler Inhale 2 puffs into the lungs every 6 (six) hours as needed for wheezing.   ALPRAZolam (XANAX) 0.25 MG tablet Take 1/2 to 1 full tab daily as needed for anxiety   clobetasol cream (TEMOVATE) AB-123456789 % Apply 2 application topically 2 (two) times a week.   diltiazem (CARDIZEM CD) 120 MG 24 hr capsule Take 1 capsule (120 mg total) by mouth daily.   Ferrous Sulfate (IRON) 325 (65 Fe) MG TABS Take 1 tablet by mouth daily after breakfast.   IRON-B12-VIT C-FA-IFC PO Take 1 tablet by mouth daily. 325mg    metoprolol tartrate (LOPRESSOR) 25 MG tablet Take 1 tablet (25 mg total) by mouth 2 (two) times daily.   rosuvastatin (CRESTOR) 5 MG tablet Take 5 mg by mouth daily.   venlafaxine (EFFEXOR) 37.5 MG tablet Take 37.5 mg by mouth every evening.      Allergies:   Other and Erythromycin   Social History   Socioeconomic History   Marital status: Widowed    Spouse name: Not on file   Number of children: Not on file   Years of education: Not on file   Highest education level: Not on file  Occupational History  Not on file  Tobacco Use   Smoking status: Former    Packs/day: .25    Types: Cigarettes    Quit date: 11/02/2016    Years since quitting: 5.4   Smokeless tobacco: Never  Vaping Use   Vaping Use: Never used  Substance and Sexual Activity   Alcohol use: No   Drug use: No   Sexual activity: Not on file  Other Topics Concern   Not on file  Social History Narrative   Not on file   Social Determinants of Health   Financial Resource Strain: Not on file  Food Insecurity: Not on file  Transportation Needs: Not on file  Physical Activity:  Not on file  Stress: Not on file  Social Connections: Not on file     Family History: The patient's family history includes CAD in her father; Heart murmur in her sister; Hyperlipidemia in her brother, sister, and sister; Hypertension in her mother, sister, sister, and son; Lupus in her sister; Mitral valve prolapse in her brother; Stomach cancer in her mother. ROS:   Please see the history of present illness.    All 14 point review of systems negative except as described per history of present illness  EKGs/Labs/Other Studies Reviewed:      Recent Labs: 03/04/2022: BUN 18; Creatinine, Ser 0.80; Hemoglobin 12.9; Platelets 265; Potassium 3.7; Sodium 136  Recent Lipid Panel    Component Value Date/Time   CHOL 208 (H) 05/07/2019 1125   TRIG 109 05/07/2019 1125   HDL 70 05/07/2019 1125   CHOLHDL 3.0 05/07/2019 1125   CHOLHDL 2.6 11/03/2016 0013   VLDL 8 11/03/2016 0013   LDLCALC 119 (H) 05/07/2019 1125    Physical Exam:    VS:  BP 124/74 (BP Location: Right Arm, Patient Position: Sitting)   Pulse 67   Ht 4' 11.5" (1.511 m)   Wt 153 lb (69.4 kg)   SpO2 96%   BMI 30.39 kg/m     Wt Readings from Last 3 Encounters:  04/08/22 153 lb (69.4 kg)  03/04/22 153 lb (69.4 kg)  12/03/20 152 lb (68.9 kg)     GEN:  Well nourished, well developed in no acute distress HEENT: Normal NECK: No JVD; No carotid bruits LYMPHATICS: No lymphadenopathy CARDIAC: RRR, no murmurs, no rubs, no gallops RESPIRATORY:  Clear to auscultation without rales, wheezing or rhonchi  ABDOMEN: Soft, non-tender, non-distended MUSCULOSKELETAL:  No edema; No deformity  SKIN: Warm and dry LOWER EXTREMITIES: no swelling NEUROLOGIC:  Alert and oriented x 3 PSYCHIATRIC:  Normal affect   ASSESSMENT:    1. Paroxysmal atrial fibrillation (HCC)   2. COPD, moderate ([place])   3. Atypical chest pain   4. Dyspnea on exertion    PLAN:    In order of problems listed above:  Paroxysmal atrial fibrillation.  She  accepted my offer to start anticoagulation, will start with Eliquis 5 mg twice daily.  I will ask her to be very careful and walk more carefully probably have him use a cane if needed to avoid any falls I was introduced to the topic of Watchman device he told me right away she does not want to do it however she wants to think about it trying to figure out frequency and really truly if she had atrial fibrillation I will ask her to wear Zio patch for 2 weeks, echocardiogram will be done to look at the size of the left atrium.  I will see her back in about 3 months  atypical chest pain denies having any Dyspnea on exertion: Stable   Medication Adjustments/Labs and Tests Ordered: Current medicines are reviewed at length with the patient today.  Concerns regarding medicines are outlined above.  No orders of the defined types were placed in this encounter.  Medication changes: No orders of the defined types were placed in this encounter.   Signed, Park Liter, MD, [facility] 04/08/2022 10:03 AM    [place]

## 2022-04-15 ENCOUNTER — Telehealth: Payer: Self-pay | Admitting: Cardiology

## 2022-04-15 NOTE — Telephone Encounter (Signed)
Pt c/o medication issue:  1. Name of Medication: apixaban (ELIQUIS) 5 MG TABS tablet   2. How are you currently taking this medication (dosage and times per day)?    Take 1 tablet (5 mg total) by mouth 2 (two) times daily.    3. Are you having a reaction (difficulty breathing--STAT)?  no  4. What is your medication issue? Patient called wanting to let us know that she hasn't started taking this medication as of yet.  She fell and injured her left eye, her PCP Dr. Lorne Skeens suggested she wait until this coming Monday, April 8th to start taking Eliquis, Dr. Lorne Skeens advised her to call us and let us know.

## 2022-04-26 ENCOUNTER — Telehealth: Payer: Self-pay

## 2022-04-26 ENCOUNTER — Telehealth: Payer: Self-pay | Admitting: Cardiology

## 2022-04-26 ENCOUNTER — Other Ambulatory Visit: Payer: Self-pay

## 2022-04-26 MED ORDER — METOPROLOL TARTRATE 25 MG PO TABS: 25.0000 mg | ORAL_TABLET | Freq: Two times a day (BID) | ORAL | 3 refills | Status: DC

## 2022-04-26 NOTE — Telephone Encounter (Signed)
Received a call from Emily with a Zio alert for this patient. Emily reported that the patient wore the heart monitor from 3/28 - 4/10 and it showed a 7% burden of A-fib. The highest a-fib rate was 183 bpm for 60 seconds. It can be located on strip 6 page 13 and she was symptomatic with the A-fib. The monitor also showed 97 runs of SVT. Informed Dr. Revankar and increased the patient's morning dose of Metoprolol from 25 to 50 mg and asked me to discuss this alert with Dr. Krasowski when he returns on 4/16. The patient's morning dose of Metoprolol was increased to 50 mg and a message was sent to Dr. Krasowski regarding this patient's zio alert.The patient was informed of the medication change and had no further questions at this time.  

## 2022-04-26 NOTE — Telephone Encounter (Signed)
Jasmin Hughes with [employer] is calling to report abnormal results.

## 2022-04-26 NOTE — Telephone Encounter (Signed)
Received a call from [facility] with a Zio alert for this patient. Irving Burton reported that the patient wore the heart monitor from 3/28 - 4/10 and it showed a 7% burden of A-fib. The highest a-fib rate was 183 bpm for 60 seconds. It can be located on strip 6 page 13 and she was symptomatic with the A-fib. The monitor also showed 97 runs of SVT. Informed Dr. Tomie China and increased the patient's morning dose of Metoprolol from 25 to 50 mg and asked me to discuss this alert with Dr. Bing Matter when he returns on 4/16. The patient's morning dose of Metoprolol was increased to 50 mg and a message was sent to Dr. Bing Matter regarding this patient's zio alert.The patient was informed of the medication change and had no further questions at this time.

## 2022-04-27 NOTE — Telephone Encounter (Signed)
Spoke with Dr. Bing Matter regarding this patient's Zio alert and he stated that he would review the patient's heart monitor report and send any recommendations if needed.

## 2022-04-28 ENCOUNTER — Ambulatory Visit (HOSPITAL_BASED_OUTPATIENT_CLINIC_OR_DEPARTMENT_OTHER)
Admission: RE | Admit: 2022-04-28 | Discharge: 2022-04-28 | Disposition: A | Discharge status: Home / Self Care | Payer: Medicare HMO | Source: Ambulatory Visit | Attending: Cardiology | Admitting: Cardiology

## 2022-04-28 DIAGNOSIS — R0609 Other forms of dyspnea: Secondary | ICD-10-CM | POA: Diagnosis present

## 2022-04-28 DIAGNOSIS — I480 Paroxysmal atrial fibrillation: Secondary | ICD-10-CM | POA: Diagnosis present

## 2022-04-28 LAB — ECHOCARDIOGRAM COMPLETE
Area-P 1/2: 4.54 cm2
S' Lateral: 2.4 cm

## 2022-04-28 NOTE — Telephone Encounter (Signed)
Spoke with Dr. Bing Matter regarding the patient's Zio alert and he stated that he would not do anything different than Dr. Tomie China has already done.

## 2022-04-30 DIAGNOSIS — G8929 Other chronic pain: Secondary | ICD-10-CM | POA: Insufficient documentation

## 2022-04-30 DIAGNOSIS — M629 Disorder of muscle, unspecified: Secondary | ICD-10-CM | POA: Insufficient documentation

## 2022-04-30 DIAGNOSIS — R293 Abnormal posture: Secondary | ICD-10-CM | POA: Insufficient documentation

## 2022-04-30 DIAGNOSIS — R29898 Other symptoms and signs involving the musculoskeletal system: Secondary | ICD-10-CM | POA: Insufficient documentation

## 2022-04-30 DIAGNOSIS — W19XXXA Unspecified fall, initial encounter: Secondary | ICD-10-CM | POA: Insufficient documentation

## 2022-04-30 DIAGNOSIS — M25512 Pain in left shoulder: Secondary | ICD-10-CM | POA: Insufficient documentation

## 2022-05-10 ENCOUNTER — Telehealth: Payer: Self-pay | Admitting: Cardiology

## 2022-05-10 NOTE — Telephone Encounter (Signed)
Patient is returning call in regards to results. Requesting return call.  

## 2022-05-10 NOTE — Telephone Encounter (Signed)
Results reviewed with pt as per Dr. Krasowski's note.  Pt verbalized understanding and had no additional questions. Routed to PCP  

## 2022-07-07 DIAGNOSIS — Z8679 Personal history of other diseases of the circulatory system: Secondary | ICD-10-CM | POA: Insufficient documentation

## 2022-07-12 ENCOUNTER — Ambulatory Visit: Payer: Medicare HMO | Attending: Cardiology | Admitting: Cardiology

## 2022-07-12 ENCOUNTER — Encounter: Payer: Self-pay | Admitting: Cardiology

## 2022-07-12 VITALS — BP 130/84 | HR 66 | Ht 59.0 in | Wt 155.0 lb

## 2022-07-12 DIAGNOSIS — E782 Mixed hyperlipidemia: Secondary | ICD-10-CM | POA: Diagnosis not present

## 2022-07-12 DIAGNOSIS — J449 Chronic obstructive pulmonary disease, unspecified: Secondary | ICD-10-CM | POA: Diagnosis not present

## 2022-07-12 DIAGNOSIS — I480 Paroxysmal atrial fibrillation: Secondary | ICD-10-CM | POA: Diagnosis not present

## 2022-07-12 DIAGNOSIS — Y92009 Unspecified place in unspecified non-institutional (private) residence as the place of occurrence of the external cause: Secondary | ICD-10-CM | POA: Insufficient documentation

## 2022-07-12 DIAGNOSIS — S0990XA Unspecified injury of head, initial encounter: Secondary | ICD-10-CM | POA: Insufficient documentation

## 2022-07-12 DIAGNOSIS — S0012XA Contusion of left eyelid and periocular area, initial encounter: Secondary | ICD-10-CM | POA: Insufficient documentation

## 2022-07-12 NOTE — Patient Instructions (Signed)

## 2022-07-12 NOTE — Progress Notes (Signed)
Cardiology Office Note:    Date:  07/12/2022   ID:  Jasmin Hughes, DOB 11/27/1945, MRN [id number]  PCP:  Herma Carson, MD  Cardiologist:  Gypsy Balsam, MD    Referring MD: Herma Carson, MD   Chief Complaint  Patient presents with   Follow-up  I am doing fine  History of Present Illness:    Jasmin Hughes is a 77 y.o. female with past medical history significant for COPD, paroxysmal atrial fibrillation, atypical chest pain.  She was seen last time in March complaining of having more palpitations, monitor has been placed which show episode of atrial fibrillation, total burden was 7%.  I brought her back to the office to talk about options for this situation.  Will talking about potentially Watchman device as well as potentially antiarrhythmic therapy my concern is the fact that she does have a frequent fall 1 time she fell down and strike her head.  She said she is feeling fine she does not want to do anything about it she however want to continue with Eliquis.  Past Medical History:  Diagnosis Date   Acute on chronic respiratory failure with hypoxia (HCC) 11/03/2016   Anxiety 12/12/2013   Atrial fibrillation with RVR (HCC) 11/02/2016   Atypical chest pain 11/16/2018   Bipolar disorder (HCC) 12/12/2013   Cerebrovascular disease 09/23/2015   COPD exacerbation (HCC) 11/02/2016   COPD, moderate (HCC) 11/02/2016   Costochondritis 09/03/2013   Diverticulitis of colon 12/12/2013   Dyspnea on exertion 11/16/2018   Elevated alkaline phosphatase level 11/19/2014   Fall down stairs 12/31/2013   Fatty liver 12/22/2015   GERD (gastroesophageal reflux disease) 09/03/2013   Hiatal hernia 05/04/2012   Hyperlipemia 12/12/2013   Hypoxia    Iron deficiency anemia 03/04/2016   Iron deficiency anemia secondary to inadequate dietary iron intake 01/13/2016   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   Left flank pain 07/30/2013   Low back pain 09/03/2013   Mild cognitive impairment 10/20/2016   Osteoporosis  12/12/2013   Overweight 01/24/2017   Paroxysmal atrial fibrillation (HCC) 11/16/2018   Pharyngitis 11/22/2014   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   BREZTRI AEROSPHERE 160-9-4.8 MCG/ACT AERO Inhale 2 puffs into the lungs daily.   cyanocobalamin (VITAMIN B12) 1000 MCG tablet Take 1,000 mcg by mouth daily.   diltiazem (CARDIZEM CD) 120 MG 24 hr capsule Take 1 capsule (120 mg total) by mouth daily.   Ferrous Sulfate (IRON) 325 (65 Fe) MG TABS Take 1 tablet by mouth daily after breakfast.   metoprolol tartrate (LOPRESSOR) 25 MG tablet Take 1 tablet (25 mg total) by mouth 2 (two) times daily. Take 2 tablets = 50 mg in the am and 1 tablet 25 mg in the pm.   rosuvastatin (CRESTOR) 5 MG tablet Take 5 mg by mouth daily.   venlafaxine (EFFEXOR) 37.5 MG tablet Take 37.5 mg by mouth every evening.    VITAMIN D PO Take 1 Capful by mouth daily. D3-10 units   [DISCONTINUED] albuterol (VENTOLIN HFA) 108 (90 Base) MCG/ACT inhaler Inhale 2 puffs into the lungs every  6 (six) hours as needed for wheezing.   [DISCONTINUED] ALPRAZolam (XANAX) 0.25 MG tablet Take 0.25 mg by mouth at bedtime as needed for anxiety or sleep.   [DISCONTINUED] clobetasol cream (TEMOVATE) 0.05 % Apply 2 application topically 2 (two) times a week.   [DISCONTINUED] IRON-B12-VIT C-FA-IFC PO Take 1 tablet by mouth daily. 325mg      Allergies:   Other and Erythromycin   Social History   Socioeconomic History   Marital status: Widowed    Spouse name: Not on file   Number of children: Not on file   Years of education: Not on file   Highest  education level: Not on file  Occupational History   Not on file  Tobacco Use   Smoking status: Former    Packs/day: .25    Types: Cigarettes    Quit date: 11/02/2016    Years since quitting: 5.6   Smokeless tobacco: Never  Vaping Use   Vaping Use: Never used  Substance and Sexual Activity   Alcohol use: No   Drug use: No   Sexual activity: Not on file  Other Topics Concern   Not on file  Social History Narrative   Not on file   Social Determinants of Health   Financial Resource Strain: Not on file  Food Insecurity: Not on file  Transportation Needs: Not on file  Physical Activity: Not on file  Stress: Not on file  Social Connections: Not on file     Family History: The patient's family history includes CAD in her father; Heart murmur in her sister; Hyperlipidemia in her brother, sister, and sister; Hypertension in her mother, sister, sister, and son; Lupus in her sister; Mitral valve prolapse in her brother; Stomach cancer in her mother. ROS:   Please see the history of present illness.    All 14 point review of systems negative except as described per history of present illness  EKGs/Labs/Other Studies Reviewed:         Recent Labs: 03/04/2022: BUN 18; Creatinine, Ser 0.80; Hemoglobin 12.9; Platelets 265; Potassium 3.7; Sodium 136  Recent Lipid Panel    Component Value Date/Time   CHOL 208 (H) 05/07/2019 1125   TRIG 109 05/07/2019 1125   HDL 70 05/07/2019 1125   CHOLHDL 3.0 05/07/2019 1125   CHOLHDL 2.6 11/03/2016 0013   VLDL 8 11/03/2016 0013   LDLCALC 119 (H) 05/07/2019 1125    Physical Exam:    VS:  BP 130/84 (BP Location: Left Arm, Patient Position: Sitting)   Pulse 66   Ht 4\' 11"  (1.499 m)   Wt 155 lb (70.3 kg)   SpO2 94%   BMI 31.31 kg/m     Wt Readings from Last 3 Encounters:  07/12/22 155 lb (70.3 kg)  04/08/22 153 lb (69.4 kg)  03/04/22 153 lb (69.4 kg)     GEN:  Well nourished, well developed in no acute distress HEENT: Normal NECK:  No JVD; No carotid bruits LYMPHATICS: No lymphadenopathy CARDIAC: RRR, no murmurs, no rubs, no gallops RESPIRATORY:  Clear to auscultation without rales, wheezing or rhonchi  ABDOMEN: Soft, non-tender, non-distended MUSCULOSKELETAL:  No edema; No deformity  SKIN: Warm and dry LOWER EXTREMITIES: no swelling NEUROLOGIC:  Alert and oriented x 3 PSYCHIATRIC:  Normal affect   ASSESSMENT:    1. Paroxysmal atrial fibrillation (HCC)   2. COPD, moderate (HCC)   3. Mixed hyperlipidemia    PLAN:    In order of problems listed above:  Paroxysmal atrial fibrillation I am  concerned about her symptoms especially dizziness and falling down that led to 1 head strike which happened before we started anticoagulation.  I do long discussion with.  I strongly suggested to consider Watchman device she said she will think it over I was getting ready to give her appointment to follow-up with our EP team for implantation of the device but she does not want to do it.  She wants to wait and see also told her about option of medical therapy on antiarrhythmic however still she does not want to do it. COPD.  Stable followed by antimedicine team. Dyslipidemia I did review K PN I have data from 2021 with LDL 119.  She is on Crestor 5 which I will continue.   Medication Adjustments/Labs and Tests Ordered: Current medicines are reviewed at length with the patient today.  Concerns regarding medicines are outlined above.  No orders of the defined types were placed in this encounter.  Medication changes: No orders of the defined types were placed in this encounter.   Signed, Georgeanna Lea, MD, [facility] 07/12/2022 1:27 PM    [facility]

## 2022-08-19 DIAGNOSIS — K8020 Calculus of gallbladder without cholecystitis without obstruction: Secondary | ICD-10-CM | POA: Insufficient documentation

## 2022-10-05 ENCOUNTER — Telehealth: Payer: Self-pay | Admitting: Cardiology

## 2022-10-05 NOTE — Telephone Encounter (Signed)
*  STAT* If patient is at the pharmacy, call can be transferred to refill team.   1. Which medications need to be refilled? (please list name of each medication and dose if known)   BREZTRI AEROSPHERE 160-9-4.8 MCG/ACT AERO    2. Would you like to learn more about the convenience, safety, & potential cost savings by using the [facility]? No   3. Are you open to using the [facility] No   4. Which pharmacy/location (including street and city if local pharmacy) is medication to be sent to? [facility] [id number] - [place], [place] - [address]     5. Do they need a 30 day or 90 day supply? 90 Day Supply

## 2022-10-05 NOTE — Telephone Encounter (Signed)
LVM to call PCP to refill Breztri.

## 2022-10-31 ENCOUNTER — Other Ambulatory Visit: Payer: Self-pay | Admitting: Cardiology

## 2022-11-01 ENCOUNTER — Telehealth: Payer: Self-pay | Admitting: Cardiology

## 2022-11-01 NOTE — Telephone Encounter (Signed)
Pt c/o medication issue:  1. Name of Medication:   metoprolol tartrate (LOPRESSOR) 25 MG tablet    2. How are you currently taking this medication (dosage and times per day)? 2 tablets in the morning and 1 tablet in the evening  3. Are you having a reaction (difficulty breathing--STAT)? no  4. What is your medication issue? Patient states the pharmacy called her and told her the office refused to refill the medication.

## 2022-11-02 NOTE — Telephone Encounter (Signed)
Patient notified of reason why med refill was denied(early fill). She aware she has an appt on 01/2023 and at that point med management will be discussed to ensure no changes are needed. Patient agreed with plan.

## 2022-11-29 ENCOUNTER — Other Ambulatory Visit: Payer: Self-pay | Admitting: Cardiology

## 2022-11-29 DIAGNOSIS — I480 Paroxysmal atrial fibrillation: Secondary | ICD-10-CM

## 2022-11-29 NOTE — Telephone Encounter (Signed)
Prescription refill request for Eliquis received. Indication: Afib  Last office visit: 07/12/22 Bing Matter)  Scr: 0.76 (10/21/22)  Age: 78 Weight: 70.3kg  Appropriate dose. Refill sent.

## 2022-12-27 DIAGNOSIS — E041 Nontoxic single thyroid nodule: Secondary | ICD-10-CM | POA: Insufficient documentation

## 2023-01-21 ENCOUNTER — Ambulatory Visit: Payer: Medicare HMO | Admitting: Cardiology

## 2023-01-25 ENCOUNTER — Encounter: Payer: Self-pay | Admitting: Cardiology

## 2023-01-25 ENCOUNTER — Ambulatory Visit: Payer: Medicare HMO | Attending: Cardiology | Admitting: Cardiology

## 2023-01-25 VITALS — BP 130/74 | HR 69 | Ht 59.0 in | Wt 152.0 lb

## 2023-01-25 DIAGNOSIS — I679 Cerebrovascular disease, unspecified: Secondary | ICD-10-CM

## 2023-01-25 DIAGNOSIS — I480 Paroxysmal atrial fibrillation: Secondary | ICD-10-CM

## 2023-01-25 DIAGNOSIS — R296 Repeated falls: Secondary | ICD-10-CM

## 2023-01-25 DIAGNOSIS — J449 Chronic obstructive pulmonary disease, unspecified: Secondary | ICD-10-CM

## 2023-01-25 DIAGNOSIS — R0789 Other chest pain: Secondary | ICD-10-CM | POA: Diagnosis not present

## 2023-01-25 DIAGNOSIS — W19XXXS Unspecified fall, sequela: Secondary | ICD-10-CM

## 2023-01-25 MED ORDER — METOPROLOL TARTRATE 25 MG PO TABS: ORAL_TABLET | ORAL | 3 refills | Status: DC

## 2023-01-25 NOTE — Patient Instructions (Signed)
 Medication Instructions:  Your physician recommends that you continue on your current medications as directed. Please refer to the Current Medication list given to you today.  *If you need a refill on your cardiac medications before your next appointment, please call your pharmacy*   Lab Work: None Ordered If you have labs (blood work) drawn today and your tests are completely normal, you will receive your results only by: MyChart Message (if you have MyChart) OR A paper copy in the mail If you have any lab test that is abnormal or we need to change your treatment, we will call you to review the results.   Testing/Procedures: None Ordered   Follow-Up: At [facility], you and your health needs are our priority.  As part of our continuing mission to provide you with exceptional heart care, we have created designated Provider Care Teams.  These Care Teams include your primary Cardiologist (physician) and Advanced Practice Providers (APPs -  Physician Assistants and Nurse Practitioners) who all work together to provide you with the care you need, when you need it.  We recommend signing up for the patient portal called MyChart.  Sign up information is provided on this After Visit Summary.  MyChart is used to connect with patients for Virtual Visits (Telemedicine).  Patients are able to view lab/test results, encounter notes, upcoming appointments, etc.  Non-urgent messages can be sent to your provider as well.   To learn more about what you can do with MyChart, go to [email or web address].    Your next appointment:   6 month(s)  The format for your next appointment:   In Person  Provider:   Lamar Fitch, MD    Other Instructions Dr. Cindie- Discussion of Watchman Device

## 2023-01-25 NOTE — Progress Notes (Signed)
 Cardiology Office Note:    Date:  01/25/2023   ID:  Jasmin Hughes, DOB March 09, 1945, MRN [id number]  PCP:  Alray Loader, MD  Cardiologist:  Lamar Fitch, MD    Referring MD: Alray Loader, MD   Chief Complaint  Patient presents with   Medication Management    Metoprolol  Tart instructions    History of Present Illness:    Jasmin Hughes is a 78 y.o. female past medical history significant for paroxysmal atrial fibrillation, she is anticoagulated her CHADS2 Vascor equals 5.  However she does have frequent falls which making very watery with a decrease in vision and put her on the medication however she still have some falls.  I had a long discussion with her about options meaning Watchman device he is reluctant but finally today he agreed to have a consultation regarding Watchman device.  Overall she is doing fine she is very active she is very happy and doing well  Past Medical History:  Diagnosis Date   Acute on chronic respiratory failure with hypoxia (HCC) 11/03/2016   Anxiety 12/12/2013   Atrial fibrillation with RVR (HCC) 11/02/2016   Atypical chest pain 11/16/2018   Bipolar disorder (HCC) 12/12/2013   Cerebrovascular disease 09/23/2015   COPD exacerbation (HCC) 11/02/2016   COPD, moderate (HCC) 11/02/2016   Costochondritis 09/03/2013   Diverticulitis of colon 12/12/2013   Dyspnea on exertion 11/16/2018   Elevated alkaline phosphatase level 11/19/2014   Fall down stairs 12/31/2013   Fatty liver 12/22/2015   GERD (gastroesophageal reflux disease) 09/03/2013   Hiatal hernia 05/04/2012   Hyperlipemia 12/12/2013   Hypoxia    Iron deficiency anemia 03/04/2016   Iron deficiency anemia secondary to inadequate dietary iron intake 01/13/2016   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   Left flank pain 07/30/2013   Low back pain 09/03/2013   Mild cognitive impairment 10/20/2016   Osteoporosis 12/12/2013   Overweight 01/24/2017   Paroxysmal atrial fibrillation (HCC) 11/16/2018   Pharyngitis  11/22/2014   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   cyanocobalamin (VITAMIN B12) 1000 MCG tablet Take 1,000 mcg by mouth daily.   diltiazem  (CARDIZEM  CD) 120 MG 24 hr capsule Take 1 capsule (120 mg total) by mouth daily.   Ferrous Sulfate (IRON) 325 (65 Fe) MG TABS Take 1 tablet by mouth daily after breakfast.   metoprolol  tartrate (LOPRESSOR ) 25 MG tablet Take 1 tablet (25 mg total) by mouth 2 (two) times daily. Take 2 tablets = 50 mg in the am and 1 tablet 25 mg in the pm.   rosuvastatin (CRESTOR) 5 MG tablet Take 5 mg by mouth daily.   venlafaxine  (EFFEXOR ) 37.5 MG tablet Take 37.5 mg by mouth every evening.    VITAMIN D PO Take 1 Capful by mouth daily. D3-10 units     Allergies:   Other and Erythromycin   Social History   Socioeconomic History   Marital status: Widowed    Spouse name: Not on file   Number of children: Not on file  Years of education: Not on file   Highest education level: Not on file  Occupational History   Not on file  Tobacco Use   Smoking status: Former    Current packs/day: 0.00    Types: Cigarettes    Quit date: 11/02/2016    Years since quitting: 6.2   Smokeless tobacco: Never  Vaping Use   Vaping status: Never Used  Substance and Sexual Activity   Alcohol use: No   Drug use: No   Sexual activity: Not on file  Other Topics Concern   Not on file  Social History Narrative   Not on file   Social Drivers of Health   Financial Resource Strain: Not on file  Food Insecurity: Not on file   Transportation Needs: Not on file  Physical Activity: Not on file  Stress: Not on file  Social Connections: Unknown (04/14/2022)   Received from [facility], [facility]   Social Network    Social Network: Not on file     Family History: The patient's family history includes CAD in her father; Heart murmur in her sister; Hyperlipidemia in her brother, sister, and sister; Hypertension in her mother, sister, sister, and son; Lupus in her sister; Mitral valve prolapse in her brother; Stomach cancer in her mother. ROS:   Please see the history of present illness.    All 14 point review of systems negative except as described per history of present illness  EKGs/Labs/Other Studies Reviewed:         Recent Labs: 03/04/2022: BUN 18; Creatinine, Ser 0.80; Hemoglobin 12.9; Platelets 265; Potassium 3.7; Sodium 136  Recent Lipid Panel    Component Value Date/Time   CHOL 208 (H) 05/07/2019 1125   TRIG 109 05/07/2019 1125   HDL 70 05/07/2019 1125   CHOLHDL 3.0 05/07/2019 1125   CHOLHDL 2.6 11/03/2016 0013   VLDL 8 11/03/2016 0013   LDLCALC 119 (H) 05/07/2019 1125    Physical Exam:    VS:  BP 130/74 (BP Location: Right Arm, Patient Position: Sitting)   Pulse 69   Ht 4' 11 (1.499 m)   Wt 152 lb (68.9 kg)   SpO2 94%   BMI 30.70 kg/m     Wt Readings from Last 3 Encounters:  01/25/23 152 lb (68.9 kg)  07/12/22 155 lb (70.3 kg)  04/08/22 153 lb (69.4 kg)     GEN:  Well nourished, well developed in no acute distress HEENT: Normal NECK: No JVD; No carotid bruits LYMPHATICS: No lymphadenopathy CARDIAC: RRR, no murmurs, no rubs, no gallops RESPIRATORY:  Clear to auscultation without rales, wheezing or rhonchi  ABDOMEN: Soft, non-tender, non-distended MUSCULOSKELETAL:  No edema; No deformity  SKIN: Warm and dry LOWER EXTREMITIES: no swelling NEUROLOGIC:  Alert and oriented x 3 PSYCHIATRIC:  Normal affect   ASSESSMENT:    1. Paroxysmal atrial fibrillation (HCC)   2.  Cerebrovascular disease   3. COPD, moderate (HCC)   4. Atypical chest pain   5. Fall in home, sequela    PLAN:    In order of problems listed above:  Paroxysmal atrial fibrillation, anticoagulated with reservations as above.  Will refer for Watchman device. COPD.  Stable. Atypical chest pain denies having any. Falls at home.  Again I asked her to be very careful will plan for Watchman device if she agree however I had a long discussion with her and difficulty trying to convince her and that this is something for her.  Will continue this discussion  Medication Adjustments/Labs and Tests Ordered: Current medicines are reviewed at length with the patient today.  Concerns regarding medicines are outlined above.  No orders of the defined types were placed in this encounter.  Medication changes: No orders of the defined types were placed in this encounter.   Signed, Lamar DOROTHA Fitch, MD, [facility] 01/25/2023 1:45 PM    [facility]

## 2023-01-25 NOTE — Addendum Note (Signed)
 Addended by: Baldo Ash D on: 01/25/2023 03:12 PM   Modules accepted: Orders

## 2023-02-01 ENCOUNTER — Telehealth: Payer: Self-pay

## 2023-02-01 NOTE — Telephone Encounter (Signed)
Per Dr. Agustin Cree, called to arrange Watchman consult with Dr. Quentin Ore.  Left message to call back.

## 2023-02-02 NOTE — Telephone Encounter (Signed)
Spoke with the patient at length.  Offered to schedule Watchman consult, but she adamantly declined.  She stated she does not wish to have a foreign object in her body.  Gave her the Structural Heart line should she change her mind. She was grateful for discussion.  Will route to Dr. Bing Matter as Lorain Childes.

## 2023-04-10 ENCOUNTER — Other Ambulatory Visit: Payer: Self-pay

## 2023-04-10 ENCOUNTER — Encounter (HOSPITAL_BASED_OUTPATIENT_CLINIC_OR_DEPARTMENT_OTHER): Payer: Self-pay | Admitting: Emergency Medicine

## 2023-04-10 ENCOUNTER — Emergency Department (HOSPITAL_BASED_OUTPATIENT_CLINIC_OR_DEPARTMENT_OTHER)
Admission: EM | Admit: 2023-04-10 | Discharge: 2023-04-10 | Disposition: A | Discharge status: Home / Self Care | Attending: Emergency Medicine | Admitting: Emergency Medicine

## 2023-04-10 DIAGNOSIS — Z7901 Long term (current) use of anticoagulants: Secondary | ICD-10-CM | POA: Insufficient documentation

## 2023-04-10 DIAGNOSIS — R21 Rash and other nonspecific skin eruption: Secondary | ICD-10-CM | POA: Diagnosis present

## 2023-04-10 NOTE — ED Triage Notes (Signed)
 Pt reports she is concerned because there is a red area on RLE that she woke up with; denies pain, no swelling noted

## 2023-04-10 NOTE — Discharge Instructions (Signed)
 We discussed getting blood work and ultrasound on your leg which you did not want  Please make sure to keep close follow-up outpatient  If you have any new or worsening symptoms or change your mind about blood work or ultrasound please return to the emergency department

## 2023-04-10 NOTE — ED Provider Notes (Signed)
 [facility] Provider Note   CSN: [id number] Arrival date & time: 04/10/23  1319    History  Chief Complaint  Patient presents with   Leg Problem    Jasmin Hughes is a 78 y.o. female here for evaluation of rash to her left lateral leg.  She states she had some itching to left lateral aspect of her leg yesterday.  Also notes she use Aspercreme on the area.  She woke up this morning and noted the area to be red.  She is on Eliquis for A-fib.  No missed doses.  No fever, chest pain, shortness of breath, numbness, weakness.  She has never had anything like this previously.  No other rashes.  She denies any recent falls or injuries. No shitake mushroom use.  HPI     Home Medications Prior to Admission medications   Medication Sig Start Date End Date Taking? Authorizing Provider  apixaban (ELIQUIS) 5 MG TABS tablet TAKE 1 TABLET BY MOUTH 2 TIMES A DAY 11/29/22   Georgeanna Lea, MD  BREZTRI AEROSPHERE 160-9-4.8 MCG/ACT AERO Inhale 2 puffs into the lungs daily. 05/17/22   [provider]  cyanocobalamin (VITAMIN B12) 1000 MCG tablet Take 1,000 mcg by mouth daily.    [provider]  diltiazem (CARDIZEM CD) 120 MG 24 hr capsule Take 1 capsule (120 mg total) by mouth daily. 11/05/16   Marinda Elk, MD  Ferrous Sulfate (IRON) 325 (65 Fe) MG TABS Take 1 tablet by mouth daily after breakfast.    [provider]  metoprolol tartrate (LOPRESSOR) 25 MG tablet 50 mg ( 2 tablets) in the am and  25 mg (1 tablet) in the pm. 01/25/23   Georgeanna Lea, MD  rosuvastatin (CRESTOR) 5 MG tablet Take 5 mg by mouth daily. 03/22/20   [provider]  venlafaxine (EFFEXOR) 37.5 MG tablet Take 37.5 mg by mouth every evening.  07/29/15   [provider]  VITAMIN D PO Take 1 Capful by mouth daily. D3-10 units    [provider]      Allergies    Other and Erythromycin    Review of Systems    Review of Systems  Constitutional: Negative.   HENT: Negative.    Respiratory: Negative.    Cardiovascular: Negative.   Gastrointestinal: Negative.   Genitourinary: Negative.   Musculoskeletal: Negative.   Skin:  Positive for rash.  Neurological: Negative.   All other systems reviewed and are negative.  Physical Exam Updated Vital Signs BP (!) 154/79 (BP Location: Right Arm)   Pulse 64   Temp 98.1 F (36.7 C)   Resp 17   Ht 4\' 11"  (1.499 m)   Wt 68.9 kg   SpO2 95%   BMI 30.68 kg/m  Physical Exam Vitals and nursing note reviewed.  Constitutional:      General: She is not in acute distress.    Appearance: She is well-developed. She is not ill-appearing, toxic-appearing or diaphoretic.  HENT:     Head: Atraumatic.  Eyes:     Pupils: Pupils are equal, round, and reactive to light.  Cardiovascular:     Rate and Rhythm: Normal rate.     Pulses: Normal pulses.          Radial pulses are 2+ on the right side and 2+ on the left side.       Dorsalis pedis pulses are 2+ on the right side and 2+ on the left  side.  Pulmonary:     Effort: Pulmonary effort is normal. No respiratory distress.     Breath sounds: Normal breath sounds and air entry.  Abdominal:     General: There is no distension.  Musculoskeletal:        General: Normal range of motion.     Cervical back: Normal range of motion.     Comments: No bony tenderness. Compartments soft. Able to flex and extend at knee without difficulty. Nontender to popliteal fossa.   Skin:    General: Skin is warm and dry.     Capillary Refill: Capillary refill takes less than 2 seconds.     Comments: Erythema to left lateral aspect leg.  No target lesions, bulla, vesicles, fluctuance, induration, macerated skin.  No petechiae, purpura.  See picture in chart.  Neurological:     General: No focal deficit present.     Mental Status: She is alert.  Psychiatric:        Mood and Affect: Mood normal.     ED Results / Procedures /  Treatments   Labs (all labs ordered are listed, but only abnormal results are displayed) Labs Reviewed - No data to display  EKG None  Radiology No results found.  Procedures Procedures    Medications Ordered in ED Medications - No data to display  ED Course/ Medical Decision Making/ A&P    78 year old here for evaluation of rash to her left lateral leg.  Noted when she woke up this morning.  She did note that she had some itching and cramping to her left calf yesterday.  She was scratching her leg in the leg and put Aspercreme to the lateral aspect of her leg.  Woke up this morning with erythema streaking like pattern.  She is afebrile, nonseptic, non-ill-appearing.  No systemic symptoms.  She is on chronic anticoagulation however no petechiae, purpura.  She denies any recent trauma or injury.  Her calves are soft, nontender.    We discussed labs, ultrasound.  Patient states she needs to leave to watch the basketball game and does not want any further workup currently would like to leave as she thinks this area is likely from scratching.  Area does not appear infectious in nature.  Do not feel she needs to be starting antibiotics.  She has good pulses distally.  Low suspicion for ischemia.  She has full range of motion to her joint without any erythema or warmth or suspicion for septic joint, gout, hemarthrosis.  No chest pain, shortness of breath, numbness or weakness. When she does not want any further workup.  The emergency department I recommended keeping a close eye on the area.  If she changes her mind about labs or imaging or symptoms or her worsen she can return.   Pt has a patent airway without stridor and is handling secretions without difficulty; no angioedema. No blisters, no pustules, no warmth, no draining sinus tracts, no superficial abscesses, no bullous impetigo, no vesicles, no desquamation, no target lesions with dusky purpura or a central bulla. Not tender to touch. No  concern for superimposed infection. No concern for SJS, TEN, TSS, tick borne illness, syphilis or other life-threatening condition, ischemia, VTE, coagulopathy.                                Medical Decision Making Amount and/or Complexity of Data Reviewed External Data Reviewed: labs, radiology and notes.  Risk OTC drugs. Decision regarding hospitalization. Diagnosis or treatment significantly limited by social determinants of health.         Final Clinical Impression(s) / ED Diagnoses Final diagnoses:  Rash    Rx / DC Orders ED Discharge Orders     None         Malaiah Viramontes A, PA-C 04/10/23 1530    Royanne Foots, DO 04/18/23 1149

## 2023-06-15 ENCOUNTER — Other Ambulatory Visit: Payer: Self-pay | Admitting: Cardiology

## 2023-06-15 DIAGNOSIS — I480 Paroxysmal atrial fibrillation: Secondary | ICD-10-CM

## 2023-06-15 NOTE — Telephone Encounter (Signed)
 Prescription refill request for Eliquis  received. Indication: PAF Last office visit: 01/25/23  Otilio Block MD Scr: 0.69 on 04/26/23  Epic Age: 78 Weight: 68.9kg  Based on above findings Eliquis  5mg  twice daily is the appropriate dose.  Refill approved.

## 2023-07-26 ENCOUNTER — Other Ambulatory Visit: Payer: Self-pay | Admitting: Cardiology

## 2023-08-18 ENCOUNTER — Emergency Department (HOSPITAL_BASED_OUTPATIENT_CLINIC_OR_DEPARTMENT_OTHER)

## 2023-08-18 ENCOUNTER — Encounter (HOSPITAL_BASED_OUTPATIENT_CLINIC_OR_DEPARTMENT_OTHER): Payer: Self-pay

## 2023-08-18 ENCOUNTER — Other Ambulatory Visit: Payer: Self-pay

## 2023-08-18 ENCOUNTER — Emergency Department (HOSPITAL_BASED_OUTPATIENT_CLINIC_OR_DEPARTMENT_OTHER)
Admission: EM | Admit: 2023-08-18 | Discharge: 2023-08-18 | Disposition: A | Discharge status: Home / Self Care | Source: Ambulatory Visit

## 2023-08-18 DIAGNOSIS — R059 Cough, unspecified: Secondary | ICD-10-CM | POA: Diagnosis present

## 2023-08-18 DIAGNOSIS — U071 COVID-19: Secondary | ICD-10-CM | POA: Insufficient documentation

## 2023-08-18 DIAGNOSIS — J449 Chronic obstructive pulmonary disease, unspecified: Secondary | ICD-10-CM | POA: Insufficient documentation

## 2023-08-18 DIAGNOSIS — Z7901 Long term (current) use of anticoagulants: Secondary | ICD-10-CM | POA: Insufficient documentation

## 2023-08-18 LAB — CBC
HCT: 40 % (ref 36.0–46.0)
Hemoglobin: 13.3 g/dL (ref 12.0–15.0)
MCH: 30.1 pg (ref 26.0–34.0)
MCHC: 33.3 g/dL (ref 30.0–36.0)
MCV: 90.5 fL (ref 80.0–100.0)
Platelets: 226 K/uL (ref 150–400)
RBC: 4.42 MIL/uL (ref 3.87–5.11)
RDW: 12.4 % (ref 11.5–15.5)
WBC: 6.5 K/uL (ref 4.0–10.5)
nRBC: 0 % (ref 0.0–0.2)

## 2023-08-18 LAB — BASIC METABOLIC PANEL WITH GFR
Anion gap: 13 (ref 5–15)
BUN: 16 mg/dL (ref 8–23)
CO2: 23 mmol/L (ref 22–32)
Calcium: 8.3 mg/dL — ABNORMAL LOW (ref 8.9–10.3)
Chloride: 103 mmol/L (ref 98–111)
Creatinine, Ser: 0.79 mg/dL (ref 0.44–1.00)
GFR, Estimated: >60 mL/min (ref >60)
Glucose, Bld: 92 mg/dL (ref 70–99)
Potassium: 3.7 mmol/L (ref 3.5–5.1)
Sodium: 138 mmol/L (ref 135–145)

## 2023-08-18 LAB — RESP PANEL BY RT-PCR (RSV, FLU A&B, COVID)  RVPGX2
Influenza A by PCR: NEGATIVE
Influenza B by PCR: NEGATIVE
Resp Syncytial Virus by PCR: NEGATIVE
SARS Coronavirus 2 by RT PCR: POSITIVE — AB

## 2023-08-18 LAB — TROPONIN T, HIGH SENSITIVITY: Troponin T High Sensitivity: <15 ng/L (ref <19)

## 2023-08-18 MED ORDER — METOPROLOL TARTRATE 25 MG PO TABS
50.0000 mg | ORAL_TABLET | Freq: Once | ORAL | Status: DC
  Filled 2023-08-18: qty 2

## 2023-08-18 MED ORDER — METOPROLOL TARTRATE 25 MG PO TABS: 25.0000 mg | ORAL_TABLET | Freq: Once | ORAL | Status: DC

## 2023-08-18 NOTE — ED Notes (Signed)
 Rt attempted to ambulate patient. When I got her up from bed, her HR increased to 220, I placed her back on monitor to verify rate and was attempting to get an EKG when she coughed and rate returned to normal. She appeared to be in SVT. PA and MD aware

## 2023-08-18 NOTE — ED Provider Notes (Signed)
 [facility] Provider Note   CSN: [id number] Arrival date & time: 08/18/23  1133     Patient presents with: Cough   Jasmin Hughes is a 78 y.o. female.   Patient with history of afib on Eliquis , COPD, hyperlipidemia presents today with complaints of cough. She reports that symptoms began on Sunday. Reports she has grandchildren that have been sick with similar symptoms.  Reports cough is productive of yellow sputum.  Reports they went to the beach on Saturday and arrived home yesterday. Reports some soreness in her chest, some shortness of breath, feeling feverish but has not checked her temperature. She has been taking mucinex. Reports she called her doctor today and they were concerned she could be in atrial fibrillation and therefore recommended she come here for evaluation.  She denies any missed doses of her Eliquis .  No recent surgeries, leg pain, or leg swelling.   Cough      Prior to Admission medications   Medication Sig Start Date End Date Taking? Authorizing Provider  BREZTRI AEROSPHERE 160-9-4.8 MCG/ACT AERO Inhale 2 puffs into the lungs daily. 05/17/22   [provider]  cyanocobalamin (VITAMIN B12) 1000 MCG tablet Take 1,000 mcg by mouth daily.    [provider]  diltiazem  (CARDIZEM  CD) 120 MG 24 hr capsule Take 1 capsule (120 mg total) by mouth daily. 11/05/16   Odell Celinda Balo, MD  ELIQUIS  5 MG TABS tablet TAKE 1 TABLET BY MOUTH 2 TIMES A DAY 06/15/23   Krasowski, Robert J, MD  Ferrous Sulfate (IRON) 325 (65 Fe) MG TABS Take 1 tablet by mouth daily after breakfast.    [provider]  metoprolol  tartrate (LOPRESSOR ) 25 MG tablet TAKE 2 TABLETS BY MOUTH IN THE IN THE MORNING AND TAKE 1 TABLET IN THE IN THE EVENING 07/27/23   Krasowski, Robert J, MD  rosuvastatin (CRESTOR) 5 MG tablet Take 5 mg by mouth daily. 03/22/20   [provider]  venlafaxine  (EFFEXOR ) 37.5 MG tablet Take 37.5 mg by  mouth every evening.  07/29/15   [provider]  VITAMIN D PO Take 1 Capful by mouth daily. D3-10 units    [provider]    Allergies: Other and Erythromycin    Review of Systems  Respiratory:  Positive for cough.   All other systems reviewed and are negative.   Updated Vital Signs BP 127/82 (BP Location: Left Arm)   Pulse (!) 109   Temp 99.3 F (37.4 C) (Oral)   Resp 20   Ht 4' 11 (1.499 m)   Wt 68 kg   SpO2 93%   BMI 30.30 kg/m   Physical Exam Vitals and nursing note reviewed.  Constitutional:      General: She is not in acute distress.    Appearance: Normal appearance. She is normal weight. She is not ill-appearing, toxic-appearing or diaphoretic.  HENT:     Head: Normocephalic and atraumatic.  Cardiovascular:     Rate and Rhythm: Normal rate and regular rhythm.     Heart sounds: Normal heart sounds.  Pulmonary:     Effort: Pulmonary effort is normal. No respiratory distress.     Breath sounds: Normal breath sounds. No wheezing.  Musculoskeletal:        General: No tenderness. Normal range of motion.     Cervical back: Normal range of motion.     Right lower leg: No edema.     Left lower leg: No edema.  Skin:    General: Skin is warm and dry.  Neurological:     General: No focal deficit present.     Mental Status: She is alert.  Psychiatric:        Mood and Affect: Mood normal.        Behavior: Behavior normal.     (all labs ordered are listed, but only abnormal results are displayed) Labs Reviewed  RESP PANEL BY RT-PCR (RSV, FLU A&B, COVID)  RVPGX2 - Abnormal; Notable for the following components:      Result Value   SARS Coronavirus 2 by RT PCR POSITIVE (*)    All other components within normal limits  BASIC METABOLIC PANEL WITH GFR - Abnormal; Notable for the following components:   Calcium 8.3 (*)    All other components within normal limits  CBC  TROPONIN T, HIGH SENSITIVITY    EKG: EKG Interpretation Date/Time:  Thursday  August 18 2023 11:45:23 EDT Ventricular Rate:  109 PR Interval:  149 QRS Duration:  89 QT Interval:  334 QTC Calculation: 450 R Axis:   38  Text Interpretation: Sinus tachycardia Abnormal R-wave progression, early transition Minimal ST depression, inferior leads Confirmed by Ula Barter [phone]) on 08/18/2023 11:47:09 AM  Radiology: [facility] Chest 2 View Result Date: 08/18/2023 CLINICAL DATA:  Shortness of breath EXAM: CHEST - 2 VIEW COMPARISON:  01/15/2021 FINDINGS: Large hiatal hernia. Heart and mediastinal contours are within normal limits. No focal opacities or effusions. No acute bony abnormality. Aortic atherosclerosis. IMPRESSION: No active cardiopulmonary disease. Electronically Signed   By: Franky Crease M.D.   On: 08/18/2023 13:30     Procedures   Medications Ordered in the ED  metoprolol  tartrate (LOPRESSOR ) tablet 50 mg (50 mg Oral Not Given 08/18/23 1451)                                    Medical Decision Making Amount and/or Complexity of Data Reviewed Labs: ordered. Radiology: ordered.  Risk Prescription drug management.   This patient is a 78 y.o. female who presents to the ED for concern of cough congestion, dyspnea, this involves an extensive number of treatment options, and is a complaint that carries with it a high risk of complications and morbidity. The emergent differential diagnosis prior to evaluation includes, but is not limited to, URI, CHF, pericardial effusion/tamponade, arrhythmias, ACS, COPD, asthma, bronchitis, pneumonia, pneumothorax, PE, anemia  This is not an exhaustive differential.   Past Medical History / Co-morbidities / Social History:  has a past medical history of Acute on chronic respiratory failure with hypoxia (HCC) (11/03/2016), Anxiety (12/12/2013), Atrial fibrillation with RVR (HCC) (11/02/2016), Atypical chest pain (11/16/2018), Bipolar disorder (HCC) (12/12/2013), Cerebrovascular disease (09/23/2015), COPD exacerbation (HCC) (11/02/2016), COPD,  moderate (HCC) (11/02/2016), Costochondritis (09/03/2013), Diverticulitis of colon (12/12/2013), Dyspnea on exertion (11/16/2018), Elevated alkaline phosphatase level (11/19/2014), Fall down stairs (12/31/2013), Fatty liver (12/22/2015), GERD (gastroesophageal reflux disease) (09/03/2013), Hiatal hernia (05/04/2012), Hyperlipemia (12/12/2013), Hypoxia, Iron deficiency anemia (03/04/2016), Iron deficiency anemia secondary to inadequate dietary iron intake (01/13/2016), 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  Additional history: Chart reviewed.  Physical Exam: Physical exam performed. The pertinent findings include: Well-appearing, speaking in complete sentences,  lung sounds clear to auscultation in all fields  Lab Tests: I ordered, and personally interpreted labs.  The pertinent results include:  COVID +, no other acute physical exam abnormalities   Imaging Studies: I ordered imaging studies including CXR. I independently visualized and interpreted imaging which showed NAD. I agree with the radiologist interpretation.   Cardiac Monitoring:  The patient was maintained on a cardiac monitor.  My attending physician Dr. Ula viewed and interpreted the cardiac monitored which showed an underlying rhythm of: sinus tachycardia. I agree with this interpretation.   Medications: I ordered medication including metoprolol   for tachycardia. Reevaluation of the patient after these medicines showed that the patient improved. I have reviewed the patients home medicines and have made adjustments as needed.  Consultations  Obtained: I requested consultation with the cardiology on call Dr. Mona,  and discussed lab and imaging findings as well as pertinent plan - they recommend: increase metoprolol  to 50 mg, no indication for admission.    Disposition: After consideration of the diagnostic results and the patients response to treatment, I feel that emergency department workup does not suggest an emergent condition requiring admission or immediate intervention beyond what has been performed at this time. The plan is: discharge with close outpatient follow-up and return precautions. Patient has COVID, likely etiology of symptoms. Upon initial attempt to ambulate, when she stood up her heartrate went into the 200s, likely SVT. Unfortunately prior to EKG capture patient coughed and her heart rate spontaneously normalized. Discussed with cardiology, they recommend increasing her metoprolol  and treating URI symptoms, no indication for admission. Her work-up is otherwise benign. After she took her metoprolol  she was able to walk around without any subsequent rhythm changes and oxygen maintained in the 90s on room air. Patient really wants to go home. Offered paxlovid for covid treatment given patients risk factors and pre-existing conditions, however she refused. Recommend OTC medication management, close outpatient pcp and cardiology follow-up, and close return precautions, specifically for any sustained SVT.  Did consider PE, however patient has no symptoms to suggest this and is already on Eliquis  and denies any missed doses. Therefore PE less likely. Evaluation and diagnostic testing in the emergency department does not suggest an emergent condition requiring admission or immediate intervention beyond what has been performed at this time.  Plan for discharge with close PCP follow-up.  Patient is understanding and amenable with plan, educated on red flag symptoms that would prompt immediate return.  Patient discharged in stable  condition.   I discussed this case with my attending physician Dr. Ula who cosigned this note including patient's presenting symptoms, physical exam, and planned diagnostics and interventions. Attending physician stated agreement with plan or made changes to plan which were implemented.    Final diagnoses:  COVID    ED Discharge Orders     None     An After Visit Summary was printed and given to the patient.      Nora Lauraine LABOR, PA-C 08/18/23 1608    Ula Prentice SAUNDERS, MD 08/19/23 [phone]

## 2023-08-18 NOTE — ED Notes (Signed)
 Pt alert and oriented X 4 at the time of discharge. RR even and unlabored. No acute distress noted. Pt verbalized understanding of discharge instructions as discussed. Pt ambulatory to lobby at time of discharge.

## 2023-08-18 NOTE — Discharge Instructions (Addendum)
 As we discussed, your workup in the ER today revealed that you have COVID.  As this is a viral illness, no antibiotics are indicated.  I did offer you antiviral management, however you declined at this treatment.  Therefore recommend symptomatic management, Tylenol  for fevers, over-the-counter cough and cold medicine, rest, and plenty of oral hydration.  Additionally, your heart did go into a very fast rhythm a few times today, talk to our cardiologist and given this is likely driven by your COVID, there is no further evaluation indicated in the hospital.  Is important that you follow-up closely with your cardiologist outpatient.  Please call and schedule an appointment at your earliest convenience.  Please monitor how you are feeling and your heart rate and if you have any sustained intervals of these elevated heart rate you need to return for evaluation.  Please be sure you are taking your home medications as prescribed every day.  Return if development of any new or worsening symptoms

## 2023-08-18 NOTE — Telephone Encounter (Addendum)
 Copied from CRM #[id number]. Topic: Clinical Concerns - Emergent Call >> Aug 18, 2023 10:45 AM Odella DEL wrote: Jasmin Hughes is calling for clinical concerns (Ask: What symptoms are you calling about today, AND how long have you had these symptoms? Must Review HPKW list for symptoms) Document Name of Triage Nurse/BH Rep taking the call when applicable)   Include all details related to the request(s) below: Patient daughter Jasmin calling regarding patient having cough, fever, chills, elevated BP 142/99 feeling dizzy and possible Afib. Did suggest ED Tried triage nurse no answer  Call back [phone]  Confirm and type the Best Contact Number below:  Patient/caller contact number:             [] Home  [] Mobile  [] Work [] Other   [x] Okay to leave a voicemail   Medication List:  Current Outpatient Medications:  .  ALPRAZolam (XANAX) 0.25 mg tablet, Take 1 tablet (0.25 mg total) by mouth daily as needed for anxiety., Disp: 30 tablet, Rfl: 0 .  apixaban  (ELIQUIS ) 5 mg tab, Take 5 mg by mouth 2 (two) times a day., Disp: , Rfl:  .  benzonatate (TESSALON) 100 mg capsule, 1-2 up to tid prn cough, Disp: 100 capsule, Rfl: 0 .  budesonide-glycopyr-formoterol (Breztri Aerosphere) 160-9-4.8 mcg/actuation HFAA, Inhale 2 puffs 2 (two) times a day. Please provide 3 months prescription at a time, Disp: 10.7 g, Rfl: 3 .  calcium carbonate (OS-CAL) 500 mg calcium (1,250 mg) chewable tablet, Take  by mouth., Disp: , Rfl:  .  cholecalciferol (VITAMIN D3) 1,000 unit (25 mcg) tablet, Take 2,000 Units by mouth Once Daily., Disp: , Rfl:  .  cyanocobalamin, vitamin B-12, 1,000 mcg ER tablet, Take 5,000 mcg by mouth Once Daily., Disp: , Rfl:  .  dilTIAZem  (CARDIZEM  CD) 120 mg 24 hr capsule, TAKE 1 CAPSULE BY MOUTH DAILY, Disp: 90 capsule, Rfl: 1 .  famotidine  (PEPCID ) 20 mg tablet, TAKE 1 TABLET BY MOUTH 2 TIMES A DAY, Disp: 60 tablet, Rfl: 4 .  iron-vit C-vit B12-folic acid  (Icar-C Plus) 100-250-25-1 mg-mg-mcg-mg tab,  Take  by mouth., Disp: , Rfl:  .  metoprolol  tartrate (LOPRESSOR ) 25 mg tablet, Take 1 tablet (25 mg total) by mouth 2 (two) times a day., Disp: 180 tablet, Rfl: 1 .  rosuvastatin (CRESTOR) 5 mg tablet, TAKE 1 TABLET BY MOUTH DAILY, Disp: 90 tablet, Rfl: 1 .  venlafaxine  (EFFEXOR ) 37.5 mg tablet, TAKE 1 TABLET BY MOUTH DAILY, Disp: 90 tablet, Rfl: 1     Medication Request/Refills: Pharmacy Information (if applicable)   [] Not Applicable       []  Pharmacy listed  Send Medication Request to:                                                 [] Pharmacy not listed (added to pharmacy list in Epic) Send Medication Request to:      Listed Pharmacies: [id number] - [place], [place] - [address] - PHONE: [phone] - FAX: [phone] [id number] - [place], [place] - [address] - PHONE: [phone] - FAX: [phone]

## 2023-08-18 NOTE — ED Notes (Signed)
 Patient ambulated with pulse ox again, this time better results. SAT 91-93%, HR 96. MD aware

## 2023-08-18 NOTE — ED Triage Notes (Signed)
 Reports int CP with productive cough w/yellow mucous and int SOB, taking OTC w/o relief.

## 2023-08-18 NOTE — ED Notes (Signed)
 ED Provider at bedside.

## 2023-09-22 ENCOUNTER — Other Ambulatory Visit: Payer: Self-pay

## 2023-09-22 ENCOUNTER — Telehealth: Payer: Self-pay

## 2023-09-22 DIAGNOSIS — I480 Paroxysmal atrial fibrillation: Secondary | ICD-10-CM

## 2023-09-22 NOTE — Telephone Encounter (Signed)
 The patient called the [facility] office asking for Dr. Krasowski. She said she is having issues with her pulse ox. She asked what the numbers mean. She said yesterday that one of the numbers read 185 and she didn't feel too good.  She feels better today, but is unable to confirm medications and repeatedly stated she is not good at taking care of herself. Gave her Dr. Karry office number. Will also route to triage.

## 2023-09-22 NOTE — Telephone Encounter (Signed)
 Patient is returning call.

## 2023-09-22 NOTE — Telephone Encounter (Signed)
 Called the patient and she reported that for four times over the past few months her heart rate has been in the 180's and then it will come back down to her normal heart rate. She uses an oximeter to check her heart. When these episodes occur, she feels like she is going to pass out, becomes short of breath and is weak all over. Spoke to Dr. Krasowski and he recommended that she be scheduled for a monitor visit and have a zio placed. I relayed Dr. Karry recommendation to the patient and she agreed. Patient had no further questions at this time.

## 2023-09-22 NOTE — Telephone Encounter (Signed)
 Attempted to call the patient back and schedule her monitor visit but she did not answer the phone. A message was left for the patient to call back the office. Patient lives in [place] and would like to have the heart monitor placed there. We are not back in [place] until 9/22. Attempting to contact the patient to see if she can come to [place] for the monitor visit.

## 2023-09-22 NOTE — Telephone Encounter (Signed)
 Called the patient and scheduled her monitor visit for 09/23/23 at 2:00 pm. Patient was made aware of this appointment and is aware that she is coming to [place] for the appointment. Patient had no further questions at this time.

## 2023-09-23 ENCOUNTER — Ambulatory Visit: Attending: Cardiology

## 2023-09-23 DIAGNOSIS — I480 Paroxysmal atrial fibrillation: Secondary | ICD-10-CM

## 2023-10-27 ENCOUNTER — Encounter: Payer: Self-pay | Admitting: Cardiology

## 2023-10-27 ENCOUNTER — Ambulatory Visit: Attending: Cardiology | Admitting: Cardiology

## 2023-10-27 VITALS — BP 140/78 | HR 62 | Wt 152.0 lb

## 2023-10-27 DIAGNOSIS — J449 Chronic obstructive pulmonary disease, unspecified: Secondary | ICD-10-CM

## 2023-10-27 DIAGNOSIS — R0609 Other forms of dyspnea: Secondary | ICD-10-CM | POA: Diagnosis not present

## 2023-10-27 MED ORDER — DILTIAZEM HCL ER COATED BEADS 180 MG PO CP24: 180.0000 mg | ORAL_CAPSULE | Freq: Every day | ORAL | 3 refills | Status: AC

## 2023-10-27 NOTE — Progress Notes (Signed)
 Cardiology Office Note:    Date:  10/27/2023   ID:  Jasmin Hughes, DOB 01-31-1945, MRN [id number]  PCP:  Alray Loader, MD  Cardiologist:  Lamar Fitch, MD    Referring MD: Alray Loader, MD   No chief complaint on file. Doing fine  History of Present Illness:    Jasmin Hughes is a 78 y.o. female past medical history significant for paroxysmal atrial fibrillation she is anticoagulated, CHA2DS2-VASc of 5 concerns about the fact that she does have frequent falls but became more careful she is started thinking about using cane which I strongly suggest we have multiple discussion about Watchman device she actually agreed to pursue it but then gave up she said she does not want to have it done.  Denies have any chest pain tightness squeezing pressure burning chest  Past Medical History:  Diagnosis Date   Acute on chronic respiratory failure with hypoxia (HCC) 11/03/2016   Anxiety 12/12/2013   Atrial fibrillation with RVR (HCC) 11/02/2016   Atypical chest pain 11/16/2018   Bipolar disorder (HCC) 12/12/2013   Cerebrovascular disease 09/23/2015   COPD exacerbation (HCC) 11/02/2016   COPD, moderate (HCC) 11/02/2016   Costochondritis 09/03/2013   Diverticulitis of colon 12/12/2013   Dyspnea on exertion 11/16/2018   Elevated alkaline phosphatase level 11/19/2014   Fall down stairs 12/31/2013   Fatty liver 12/22/2015   GERD (gastroesophageal reflux disease) 09/03/2013   Hiatal hernia 05/04/2012   Hyperlipemia 12/12/2013   Hypoxia    Iron deficiency anemia 03/04/2016   Iron deficiency anemia secondary to inadequate dietary iron intake 01/13/2016   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   Left flank pain 07/30/2013   Low back pain 09/03/2013   Mild cognitive impairment 10/20/2016   Osteoporosis 12/12/2013   Overweight 01/24/2017   Paroxysmal atrial fibrillation (HCC) 11/16/2018   Pharyngitis 11/22/2014   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   celecoxib (CELEBREX) 100 MG capsule Take 100 mg by mouth as directed.   cyanocobalamin (VITAMIN B12) 1000 MCG tablet Take 1,000 mcg by mouth daily.   diltiazem  (CARDIZEM  CD) 120 MG 24 hr capsule Take 1 capsule (120 mg total) by mouth daily.   ELIQUIS  5 MG TABS tablet TAKE 1 TABLET BY MOUTH 2 TIMES A DAY   famotidine  (PEPCID ) 20 MG tablet Take 20 mg by mouth 2 (two) times daily.   Ferrous Sulfate (IRON) 325 (65 Fe) MG TABS Take 1 tablet by mouth daily after breakfast.   metoprolol  tartrate (LOPRESSOR ) 25 MG tablet TAKE 2 TABLETS BY MOUTH IN THE IN THE MORNING AND TAKE 1 TABLET IN THE IN THE EVENING   rosuvastatin (CRESTOR) 5 MG tablet Take 5 mg by mouth daily.   venlafaxine  (EFFEXOR ) 37.5 MG tablet Take 37.5 mg by mouth every evening.    VITAMIN D PO Take 1 Capful by mouth daily. D3-10 units     Allergies:   Other and Erythromycin   Social History   Socioeconomic History   Marital status: Widowed    Spouse name: Not on file   Number of children: Not on file   Years of education:  Not on file   Highest education level: Not on file  Occupational History   Not on file  Tobacco Use   Smoking status: Former    Current packs/day: 0.00    Types: Cigarettes    Quit date: 11/02/2016    Years since quitting: 6.9   Smokeless tobacco: Never  Vaping Use   Vaping status: Never Used  Substance and Sexual Activity   Alcohol use: No   Drug use: No   Sexual activity: Not on file  Other Topics Concern   Not on file  Social History Narrative   Not on file   Social Drivers of Health   Financial Resource Strain: Not on file  Food Insecurity: Not on file   Transportation Needs: Not on file  Physical Activity: Not on file  Stress: Not on file  Social Connections: Unknown (04/14/2022)   [facility]   Social Network    Social Network: Not on file     Family History: The patient's family history includes CAD in her father; Heart murmur in her sister; Hyperlipidemia in her brother, sister, and sister; Hypertension in her mother, sister, sister, and son; Lupus in her sister; Mitral valve prolapse in her brother; Stomach cancer in her mother. ROS:   Please see the history of present illness.    All 14 point review of systems negative except as described per history of present illness  EKGs/Labs/Other Studies Reviewed:         Recent Labs: 08/18/2023: BUN 16; Creatinine, Ser 0.79; Hemoglobin 13.3; Platelets 226; Potassium 3.7; Sodium 138  Recent Lipid Panel    Component Value Date/Time   CHOL 208 (H) 05/07/2019 1125   TRIG 109 05/07/2019 1125   HDL 70 05/07/2019 1125   CHOLHDL 3.0 05/07/2019 1125   CHOLHDL 2.6 11/03/2016 0013   VLDL 8 11/03/2016 0013   LDLCALC 119 (H) 05/07/2019 1125    Physical Exam:    VS:  BP (!) 140/78   Pulse 62   Wt 152 lb (68.9 kg)   SpO2 93%   BMI 30.70 kg/m     Wt Readings from Last 3 Encounters:  10/27/23 152 lb (68.9 kg)  08/18/23 150 lb (68 kg)  04/10/23 151 lb 14.4 oz (68.9 kg)     GEN:  Well nourished, well developed in no acute distress HEENT: Normal NECK: No JVD; No carotid bruits LYMPHATICS: No lymphadenopathy CARDIAC: RRR, no murmurs, no rubs, no gallops RESPIRATORY:  Clear to auscultation without rales, wheezing or rhonchi  ABDOMEN: Soft, non-tender, non-distended MUSCULOSKELETAL:  No edema; No deformity  SKIN: Warm and dry LOWER EXTREMITIES: no swelling NEUROLOGIC:  Alert and oriented x 3 PSYCHIATRIC:  Normal affect   ASSESSMENT:    1. COPD, moderate (HCC)   2. Dyspnea on exertion    PLAN:    In order of problems listed above:  Paroxysmal atrial  fibrillation.  Anticoagulated which I will continue, she did wear a monitor did not show any evidence of atrial fibrillation but did show some supraventricular tachycardia with fast ventricular rate.  I will increase dose of Cardizem  from 120-180. Dyspnea on exertion stable, she does have COPD continue present management. Frequent falls again very worrisome scenario I spoke with her about to her about being careful and I strongly advised to start using cane she seems to be open for that suggestions were made, mention Watchman device she does not want to do it   Medication Adjustments/Labs and Tests Ordered: Current medicines are  reviewed at length with the patient today.  Concerns regarding medicines are outlined above.  No orders of the defined types were placed in this encounter.  Medication changes: No orders of the defined types were placed in this encounter.   Signed, Lamar DOROTHA Fitch, MD, [facility] 10/27/2023 1:46 PM    [facility]

## 2023-10-27 NOTE — Addendum Note (Signed)
 Addended by: ARLOA PLANAS D on: 10/27/2023 02:05 PM   Modules accepted: Orders

## 2023-10-27 NOTE — Patient Instructions (Signed)
 Medication Instructions:   INCREASE: Cardizem  to 180mg  daily   Lab Work: None Ordered If you have labs (blood work) drawn today and your tests are completely normal, you will receive your results only by: MyChart Message (if you have MyChart) OR A paper copy in the mail If you have any lab test that is abnormal or we need to change your treatment, we will call you to review the results.   Testing/Procedures: None Ordered   Follow-Up: At [facility], you and your health needs are our priority.  As part of our continuing mission to provide you with exceptional heart care, we have created designated Provider Care Teams.  These Care Teams include your primary Cardiologist (physician) and Advanced Practice Providers (APPs -  Physician Assistants and Nurse Practitioners) who all work together to provide you with the care you need, when you need it.  We recommend signing up for the patient portal called MyChart.  Sign up information is provided on this After Visit Summary.  MyChart is used to connect with patients for Virtual Visits (Telemedicine).  Patients are able to view lab/test results, encounter notes, upcoming appointments, etc.  Non-urgent messages can be sent to your provider as well.   To learn more about what you can do with MyChart, go to [email or web address].    Your next appointment:   6 month(s)  The format for your next appointment:   In Person  Provider:   Lamar Fitch, MD    Other Instructions NA

## 2023-11-15 ENCOUNTER — Emergency Department (HOSPITAL_BASED_OUTPATIENT_CLINIC_OR_DEPARTMENT_OTHER)

## 2023-11-15 ENCOUNTER — Other Ambulatory Visit: Payer: Self-pay

## 2023-11-15 ENCOUNTER — Emergency Department (HOSPITAL_BASED_OUTPATIENT_CLINIC_OR_DEPARTMENT_OTHER)
Admission: EM | Admit: 2023-11-15 | Discharge: 2023-11-15 | Disposition: A | Discharge status: Home / Self Care | Attending: Emergency Medicine | Admitting: Emergency Medicine

## 2023-11-15 DIAGNOSIS — Z8673 Personal history of transient ischemic attack (TIA), and cerebral infarction without residual deficits: Secondary | ICD-10-CM | POA: Diagnosis not present

## 2023-11-15 DIAGNOSIS — R002 Palpitations: Secondary | ICD-10-CM | POA: Diagnosis present

## 2023-11-15 DIAGNOSIS — I482 Chronic atrial fibrillation, unspecified: Secondary | ICD-10-CM | POA: Diagnosis not present

## 2023-11-15 DIAGNOSIS — J449 Chronic obstructive pulmonary disease, unspecified: Secondary | ICD-10-CM | POA: Insufficient documentation

## 2023-11-15 DIAGNOSIS — R42 Dizziness and giddiness: Secondary | ICD-10-CM | POA: Diagnosis not present

## 2023-11-15 DIAGNOSIS — Z7901 Long term (current) use of anticoagulants: Secondary | ICD-10-CM | POA: Diagnosis not present

## 2023-11-15 DIAGNOSIS — R0602 Shortness of breath: Secondary | ICD-10-CM | POA: Diagnosis not present

## 2023-11-15 LAB — CBC
HCT: 39.7 % (ref 36.0–46.0)
Hemoglobin: 13.1 g/dL (ref 12.0–15.0)
MCH: 30.3 pg (ref 26.0–34.0)
MCHC: 33 g/dL (ref 30.0–36.0)
MCV: 91.9 fL (ref 80.0–100.0)
Platelets: 284 K/uL (ref 150–400)
RBC: 4.32 MIL/uL (ref 3.87–5.11)
RDW: 13.2 % (ref 11.5–15.5)
WBC: 7.1 K/uL (ref 4.0–10.5)
nRBC: 0 % (ref 0.0–0.2)

## 2023-11-15 LAB — TROPONIN T, HIGH SENSITIVITY
Troponin T High Sensitivity: <15 ng/L (ref 0–19)
Troponin T High Sensitivity: <15 ng/L (ref 0–19)

## 2023-11-15 LAB — BASIC METABOLIC PANEL WITH GFR
Anion gap: 13 (ref 5–15)
BUN: 21 mg/dL (ref 8–23)
CO2: 23 mmol/L (ref 22–32)
Calcium: 9.3 mg/dL (ref 8.9–10.3)
Chloride: 107 mmol/L (ref 98–111)
Creatinine, Ser: 0.9 mg/dL (ref 0.44–1.00)
GFR, Estimated: >60 mL/min (ref >60)
Glucose, Bld: 116 mg/dL — ABNORMAL HIGH (ref 70–99)
Potassium: 4.1 mmol/L (ref 3.5–5.1)
Sodium: 142 mmol/L (ref 135–145)

## 2023-11-15 NOTE — ED Provider Notes (Signed)
 [facility] Provider Note   CSN: [id number] Arrival date & time: 11/15/23  1431     Patient presents with: Dizziness, Shortness of Breath, and Palpitations   Jasmin Hughes is a 78 y.o. female.   Patient is a 78 year old female with a history of paroxysmal atrial fibrillation on Eliquis , COPD, prior CVA, hyperlipidemia, iron deficiency who is presenting today with several complaints.  Patient reports that she had multiple episodes of a feeling like her heart was racing and in her throat that resulted in jaw tightness and chest tightness today.  She reports most the time the symptoms start while she is standing and she has to go lay down and they will usually resolve within 10 to 20 minutes.  The last time she had 1 of these episodes was approximately 2 weeks ago until today when she was standing in her kitchen.  She had the episode and went and laid down and it resolved however she has had 3 more today.  She reports when this happened it also makes her anxious and she feels like she is having a hard time breathing and will take a puff on her inhaler.  She denies symptoms currently and reports in between episodes she feels fine.  She denies any recent upper respiratory symptoms or illness.  She has not had a productive cough.  She has had only 1 medication change and it was how she was taking magnesium but everything else has been the same.  She has not missed any doses of her anticoagulant.  She has had swelling and pain in her left knee because she has had multiple falls and injections over the last month.  The history is provided by the patient.  Dizziness Associated symptoms: palpitations and shortness of breath   Shortness of Breath Palpitations Associated symptoms: dizziness and shortness of breath        Prior to Admission medications   Medication Sig Start Date End Date Taking? Authorizing Provider  BREZTRI AEROSPHERE 160-9-4.8 MCG/ACT  AERO Inhale 2 puffs into the lungs daily. 05/17/22   [provider]  celecoxib (CELEBREX) 100 MG capsule Take 100 mg by mouth as directed. 10/13/23   [provider]  cyanocobalamin (VITAMIN B12) 1000 MCG tablet Take 1,000 mcg by mouth daily.    [provider]  diltiazem  (CARDIZEM  CD) 180 MG 24 hr capsule Take 1 capsule (180 mg total) by mouth daily. 10/27/23 01/25/24  Krasowski, Robert J, MD  ELIQUIS  5 MG TABS tablet TAKE 1 TABLET BY MOUTH 2 TIMES A DAY 06/15/23   Krasowski, Robert J, MD  famotidine  (PEPCID ) 20 MG tablet Take 20 mg by mouth 2 (two) times daily. 08/19/23   [provider]  Ferrous Sulfate (IRON) 325 (65 Fe) MG TABS Take 1 tablet by mouth daily after breakfast.    [provider]  metoprolol  tartrate (LOPRESSOR ) 25 MG tablet TAKE 2 TABLETS BY MOUTH IN THE IN THE MORNING AND TAKE 1 TABLET IN THE IN THE EVENING 07/27/23   Krasowski, Robert J, MD  rosuvastatin (CRESTOR) 5 MG tablet Take 5 mg by mouth daily. 03/22/20   [provider]  venlafaxine  (EFFEXOR ) 37.5 MG tablet Take 37.5 mg by mouth every evening.  07/29/15   [provider]  VITAMIN D PO Take 1 Capful by mouth daily. D3-10 units    [provider]    Allergies: Other and Erythromycin    Review of Systems  Respiratory:  Positive  for shortness of breath.   Cardiovascular:  Positive for palpitations.  Neurological:  Positive for dizziness.    Updated Vital Signs BP 132/72   Pulse 64   Temp (!) 97.5 F (36.4 C) (Oral)   Resp (!) 22   Ht 4' 11 (1.499 m)   Wt 68 kg   SpO2 95%   BMI 30.30 kg/m   Physical Exam Vitals and nursing note reviewed.  Constitutional:      General: She is not in acute distress.    Appearance: She is well-developed.  HENT:     Head: Normocephalic and atraumatic.  Eyes:     Pupils: Pupils are equal, round, and reactive to light.  Cardiovascular:     Rate and Rhythm: Normal rate and regular rhythm.     Pulses: Normal  pulses.     Heart sounds: Normal heart sounds. No murmur heard.    No friction rub.  Pulmonary:     Effort: Pulmonary effort is normal. No tachypnea.     Breath sounds: Decreased breath sounds present. No wheezing or rales.  Abdominal:     General: Bowel sounds are normal. There is no distension.     Palpations: Abdomen is soft.     Tenderness: There is no abdominal tenderness. There is no guarding or rebound.  Musculoskeletal:        General: Tenderness present. Normal range of motion.     Comments: Swelling in the suprapatellar area of the left knee.  Pain with palpation of the medial thigh  Skin:    General: Skin is warm and dry.     Findings: No rash.  Neurological:     Mental Status: She is alert and oriented to person, place, and time. Mental status is at baseline.     Cranial Nerves: No cranial nerve deficit.  Psychiatric:        Mood and Affect: Mood normal.        Behavior: Behavior normal.     (all labs ordered are listed, but only abnormal results are displayed) Labs Reviewed  BASIC METABOLIC PANEL WITH GFR - Abnormal; Notable for the following components:      Result Value   Glucose, Bld 116 (*)    All other components within normal limits  CBC  TROPONIN T, HIGH SENSITIVITY  TROPONIN T, HIGH SENSITIVITY    EKG: EKG Interpretation Date/Time:  Tuesday November 15 2023 14:42:54 EST Ventricular Rate:  84 PR Interval:  156 QRS Duration:  87 QT Interval:  384 QTC Calculation: 454 R Axis:   53  Text Interpretation: Sinus rhythm Abnormal R-wave progression, early transition Confirmed by Darra Chew [phone]) on 11/15/2023 2:51:03 PM  Radiology: US  Venous Img Lower  Left (DVT Study) Result Date: 11/15/2023 EXAM: ULTRASOUND DUPLEX OF THE LEFT LOWER EXTREMITY VEINS TECHNIQUE: Duplex ultrasound using B-mode/gray scaled imaging and Doppler spectral analysis and color flow was obtained of the deep venous structures of the left lower extremity. COMPARISON: None. CLINICAL  HISTORY: Left leg swelling and pain. FINDINGS: The common femoral vein, femoral vein, popliteal vein, and posterior tibial vein of the _laterality_ lower extremity demonstrate normal compressibility with normal color flow and spectral analysis. IMPRESSION: 1. No evidence of DVT. Electronically signed by: Franky Crease MD 11/15/2023 06:31 PM EST RP Workstation: [id number]   DG Chest 2 View Result Date: 11/15/2023 EXAM: 2 VIEW(S) XRAY OF THE CHEST 11/15/2023 03:06:00 PM COMPARISON: 08/18/2023 CLINICAL HISTORY: Shortness of breath, dizziness with exertion, and palpitations. FINDINGS: LUNGS AND PLEURA: Parenchymal  change. No pulmonary edema. No pleural effusion. No pneumothorax. HEART AND MEDIASTINUM: Moderate hiatal hernia. Atheromatous vascular calcification of the aortic arch. BONES AND SOFT TISSUES: No acute osseous abnormality. IMPRESSION: 1. Moderate hiatal hernia. 2. Atheromatous vascular calcification of the aortic arch. Electronically signed by: Ryan Salvage MD 11/15/2023 03:35 PM EST RP Workstation: [id number]     Procedures   Medications Ordered in the ED - No data to display                                  Medical Decision Making Amount and/or Complexity of Data Reviewed External Data Reviewed: notes. Labs: ordered. Decision-making details documented in ED Course. Radiology: ordered and independent interpretation performed. Decision-making details documented in ED Course. ECG/medicine tests: ordered and independent interpretation performed. Decision-making details documented in ED Course.   Pt with multiple medical problems and comorbidities and presenting today with a complaint that caries a high risk for morbidity and mortality.   Here today with the above complaint.  Concern that patient may be going in and out of A-fib causing these episodes or possible other dysrhythmia.  Possibility for ACS however feel less likely given the way patient describes the symptoms.  Patient is  anticoagulated for A-fib and lower suspicion for DVT or PE but because she has had leg swelling we will get an ultrasound.  Also concern for anemia, electrolyte abnormalities, TSH that was checked last week was normal.  Patient currently symptom-free and heart rate in the 60s.  I independently interpreted patient's labs and EKG.  EKG today shows a sinus rhythm without acute changes, troponin x 2 are normal, CBC within normal limits, BMP is within normal limits. I have independently visualized and interpreted pt's images today.  Chest x-ray without acute findings today.  Radiology does report a moderate hiatal hernia and vascular calcification of the aortic arch.  DVT study was negative for DVT.   Patient has been on continuous cardiac monitoring without evidence of any dysrhythmia.  Discussed all the findings with the patient.  She has been using her albuterol  more than she is supposed to and also concerned that this could be causing palpitations as well.  She reports she will decrease the amount of albuterol  she is using but also will follow-up with her cardiologist if she continues to have issues but reports she just recently had a monitor and everything was normal.  She was given return precautions however at this time she appears stable for discharge home.       Final diagnoses:  Palpitations    ED Discharge Orders     None          Doretha Folks, MD 11/15/23 [phone]

## 2023-11-15 NOTE — ED Triage Notes (Signed)
 Pt c/o shob, dizziness with exertion, palpitations- ongoing issues- but worse this AM.

## 2023-11-15 NOTE — ED Notes (Signed)
 ED Provider at bedside.

## 2023-11-15 NOTE — ED Notes (Signed)
 Pt refused to have Peripheral IV.

## 2023-11-15 NOTE — Discharge Instructions (Signed)
 The ultrasound did not show any sign of clot and otherwise your blood work looked good.  Only use your albuterol  inhaler how they have told you because that sometimes can cause palpitations as well.  However if it returns and it does not get better or you pass out or you are having ongoing shortness of breath and chest pain return to the emergency room

## 2023-11-16 ENCOUNTER — Telehealth: Payer: Self-pay

## 2023-11-16 ENCOUNTER — Ambulatory Visit (HOSPITAL_BASED_OUTPATIENT_CLINIC_OR_DEPARTMENT_OTHER)
Admission: RE | Admit: 2023-11-16 | Discharge: 2023-11-16 | Disposition: A | Discharge status: Home / Self Care | Source: Ambulatory Visit | Attending: Cardiology | Admitting: Cardiology

## 2023-11-16 ENCOUNTER — Other Ambulatory Visit: Payer: Self-pay | Admitting: Cardiology

## 2023-11-16 ENCOUNTER — Telehealth: Payer: Self-pay | Admitting: Cardiology

## 2023-11-16 DIAGNOSIS — R931 Abnormal findings on diagnostic imaging of heart and coronary circulation: Secondary | ICD-10-CM | POA: Insufficient documentation

## 2023-11-16 DIAGNOSIS — I480 Paroxysmal atrial fibrillation: Secondary | ICD-10-CM | POA: Diagnosis not present

## 2023-11-16 NOTE — Telephone Encounter (Signed)
 Patient c/o Palpitations:  STAT if patient reporting lightheadedness, shortness of breath, or chest pain  How long have you had palpitations/irregular HR/ Afib? Are you having the symptoms now? The past 3 days   Are you currently experiencing lightheadedness, SOB or CP? Lightheadedness   Do you have a history of afib (atrial fibrillation) or irregular heart rhythm? Yes   Have you checked your BP or HR? (document readings if available): N/a   Are you experiencing any other symptoms? Dizziness, presyncope    Pt has been having spells of afib accompanied by dizziness and presyncope. Pt had ED visit 11/4. Please advise.   Pt c/o medication issue:  1. Name of Medication:   diltiazem  (CARDIZEM  CD) 180 MG 24 hr capsule    2. How are you currently taking this medication (dosage and times per day)?   Take 1 capsule (180 mg total) by mouth daily.    3. Are you having a reaction (difficulty breathing--STAT)? NO   4. What is your medication issue? Pt thinks afib episodes may be related to medication increase.

## 2023-11-16 NOTE — Progress Notes (Signed)
 FFR Order

## 2023-11-16 NOTE — Telephone Encounter (Signed)
 Spoke at length with pt regarding spells of increased HR and dizziness. Per Dr. Krasowski increase Cardizem  CD to 240mg . Pt refused as she feels the Cardizem  is causing the problem. She stated that she will try to hydrate better, use her Albuterol  sparingly and cut down on caffeine and let us  know how she is doing in a few days.

## 2023-11-22 ENCOUNTER — Ambulatory Visit: Payer: Self-pay | Admitting: Cardiology

## 2023-12-16 ENCOUNTER — Other Ambulatory Visit: Payer: Self-pay | Admitting: Cardiology

## 2023-12-16 DIAGNOSIS — I480 Paroxysmal atrial fibrillation: Secondary | ICD-10-CM

## 2023-12-16 NOTE — Telephone Encounter (Signed)
 Eliquis  5mg  refill request received. Patient is 77 years old, weight-68kg, Crea-0.90 on 11/15/23, Diagnosis-Afib, and last seen by Dr. Mason on 10/27/23. Dose is appropriate based on dosing criteria. Will send in refill to requested pharmacy.

## 2024-01-26 ENCOUNTER — Other Ambulatory Visit: Payer: Self-pay | Admitting: Cardiology
